# Patient Record
Sex: Female | Born: 1975 | Race: White | Hispanic: No | Marital: Married | Smoking: Never smoker
Health system: Southern US, Community
[De-identification: ages and names within clinical notes are randomized; demographics above are authoritative.]

## PROBLEM LIST (undated history)

## (undated) DIAGNOSIS — F32A Depression, unspecified: Secondary | ICD-10-CM

## (undated) DIAGNOSIS — F329 Major depressive disorder, single episode, unspecified: Secondary | ICD-10-CM

## (undated) DIAGNOSIS — F419 Anxiety disorder, unspecified: Secondary | ICD-10-CM

## (undated) DIAGNOSIS — R87619 Unspecified abnormal cytological findings in specimens from cervix uteri: Secondary | ICD-10-CM

## (undated) HISTORY — DX: Unspecified abnormal cytological findings in specimens from cervix uteri: R87.619

## (undated) HISTORY — DX: Major depressive disorder, single episode, unspecified: F32.9

## (undated) HISTORY — PX: WISDOM TOOTH EXTRACTION: SHX21

## (undated) HISTORY — DX: Anxiety disorder, unspecified: F41.9

## (undated) HISTORY — DX: Depression, unspecified: F32.A

---

## 2002-03-15 HISTORY — PX: COLPOSCOPY: SHX161

## 2006-03-15 HISTORY — PX: OTHER SURGICAL HISTORY: SHX169

## 2007-12-19 ENCOUNTER — Ambulatory Visit: Payer: Self-pay | Admitting: Oncology

## 2007-12-22 LAB — CBC & DIFF AND RETIC
BASO%: 0.9 % (ref 0.0–2.0)
Eosinophils Absolute: 0 10*3/uL (ref 0.0–0.5)
HCT: 34.3 % — ABNORMAL LOW (ref 34.8–46.6)
LYMPH%: 28.1 % (ref 14.0–48.0)
MCHC: 35 g/dL (ref 32.0–36.0)
MCV: 88.4 fL (ref 81.0–101.0)
MONO%: 8.2 % (ref 0.0–13.0)
NEUT%: 61.7 % (ref 39.6–76.8)
Platelets: 238 10*3/uL (ref 145–400)
RBC: 3.88 10*6/uL (ref 3.70–5.32)
Retic %: 2 % (ref 0.4–2.3)

## 2007-12-22 LAB — MORPHOLOGY

## 2007-12-27 LAB — TOXOPLASMA GONDII ANTIBODY, IGM: Toxoplasma Antibody- IgM: 0.44 IV

## 2007-12-27 LAB — ANA: Anti Nuclear Antibody(ANA): NEGATIVE

## 2007-12-27 LAB — HEMOGLOBINOPATHY EVALUATION
Hemoglobin Other: 0 % (ref 0.0–0.0)
Hgb A: 97.4 % (ref 96.8–97.8)
Hgb F Quant: 0 % (ref 0.0–2.0)
Hgb S Quant: 0 % (ref 0.0–0.0)

## 2007-12-27 LAB — TOXOPLASMA GONDII ANTIBODY, IGG: Toxoplasma IgG Ratio: 0.7 IU/mL

## 2007-12-27 LAB — EPSTEIN BARR VIRUS(EBV) BY PCR

## 2007-12-28 LAB — EHRLICHIA ANTIBODY PANEL: E chaffeensis (HGE) Ab, IgM: <1:20 {titer}

## 2016-03-15 HISTORY — PX: MOHS SURGERY: SHX181

## 2016-06-22 DIAGNOSIS — Z1231 Encounter for screening mammogram for malignant neoplasm of breast: Secondary | ICD-10-CM | POA: Diagnosis not present

## 2016-06-22 DIAGNOSIS — Z6827 Body mass index (BMI) 27.0-27.9, adult: Secondary | ICD-10-CM | POA: Diagnosis not present

## 2016-06-22 DIAGNOSIS — R635 Abnormal weight gain: Secondary | ICD-10-CM | POA: Diagnosis not present

## 2016-07-22 DIAGNOSIS — Z6826 Body mass index (BMI) 26.0-26.9, adult: Secondary | ICD-10-CM | POA: Diagnosis not present

## 2016-08-20 DIAGNOSIS — E663 Overweight: Secondary | ICD-10-CM | POA: Diagnosis not present

## 2016-11-19 DIAGNOSIS — D485 Neoplasm of uncertain behavior of skin: Secondary | ICD-10-CM | POA: Diagnosis not present

## 2016-11-19 DIAGNOSIS — I781 Nevus, non-neoplastic: Secondary | ICD-10-CM | POA: Diagnosis not present

## 2016-11-19 DIAGNOSIS — L718 Other rosacea: Secondary | ICD-10-CM | POA: Diagnosis not present

## 2016-12-07 DIAGNOSIS — D485 Neoplasm of uncertain behavior of skin: Secondary | ICD-10-CM | POA: Diagnosis not present

## 2016-12-07 DIAGNOSIS — D229 Melanocytic nevi, unspecified: Secondary | ICD-10-CM | POA: Diagnosis not present

## 2016-12-14 ENCOUNTER — Ambulatory Visit: Payer: Self-pay | Admitting: Obstetrics and Gynecology

## 2016-12-28 DIAGNOSIS — D229 Melanocytic nevi, unspecified: Secondary | ICD-10-CM | POA: Diagnosis not present

## 2016-12-28 DIAGNOSIS — D485 Neoplasm of uncertain behavior of skin: Secondary | ICD-10-CM | POA: Diagnosis not present

## 2017-03-10 DIAGNOSIS — H5213 Myopia, bilateral: Secondary | ICD-10-CM | POA: Diagnosis not present

## 2017-03-15 HISTORY — PX: BREAST BIOPSY: SHX20

## 2017-04-15 DIAGNOSIS — J209 Acute bronchitis, unspecified: Secondary | ICD-10-CM | POA: Diagnosis not present

## 2017-06-09 DIAGNOSIS — L578 Other skin changes due to chronic exposure to nonionizing radiation: Secondary | ICD-10-CM | POA: Diagnosis not present

## 2017-06-09 DIAGNOSIS — L905 Scar conditions and fibrosis of skin: Secondary | ICD-10-CM | POA: Diagnosis not present

## 2017-06-09 DIAGNOSIS — Z86018 Personal history of other benign neoplasm: Secondary | ICD-10-CM | POA: Diagnosis not present

## 2017-10-25 DIAGNOSIS — L219 Seborrheic dermatitis, unspecified: Secondary | ICD-10-CM | POA: Diagnosis not present

## 2017-10-25 DIAGNOSIS — R221 Localized swelling, mass and lump, neck: Secondary | ICD-10-CM | POA: Diagnosis not present

## 2017-10-25 DIAGNOSIS — L309 Dermatitis, unspecified: Secondary | ICD-10-CM | POA: Diagnosis not present

## 2018-01-23 ENCOUNTER — Other Ambulatory Visit: Payer: Self-pay | Admitting: Obstetrics and Gynecology

## 2018-01-23 DIAGNOSIS — Z1231 Encounter for screening mammogram for malignant neoplasm of breast: Secondary | ICD-10-CM

## 2018-01-25 ENCOUNTER — Encounter: Payer: Self-pay | Admitting: Radiology

## 2018-01-25 ENCOUNTER — Ambulatory Visit
Admission: RE | Admit: 2018-01-25 | Discharge: 2018-01-25 | Disposition: A | Discharge status: Home / Self Care | Payer: BLUE CROSS/BLUE SHIELD | Source: Ambulatory Visit | Attending: Obstetrics and Gynecology | Admitting: Obstetrics and Gynecology

## 2018-01-25 DIAGNOSIS — R928 Other abnormal and inconclusive findings on diagnostic imaging of breast: Secondary | ICD-10-CM | POA: Diagnosis not present

## 2018-01-25 DIAGNOSIS — Z1231 Encounter for screening mammogram for malignant neoplasm of breast: Secondary | ICD-10-CM | POA: Diagnosis not present

## 2018-01-31 ENCOUNTER — Other Ambulatory Visit: Payer: Self-pay | Admitting: Obstetrics and Gynecology

## 2018-01-31 DIAGNOSIS — N6310 Unspecified lump in the right breast, unspecified quadrant: Secondary | ICD-10-CM

## 2018-01-31 DIAGNOSIS — R928 Other abnormal and inconclusive findings on diagnostic imaging of breast: Secondary | ICD-10-CM

## 2018-02-02 ENCOUNTER — Telehealth: Payer: Self-pay | Admitting: Obstetrics and Gynecology

## 2018-02-02 NOTE — Telephone Encounter (Signed)
Patient is calling for results. Please advise  °

## 2018-02-02 NOTE — Telephone Encounter (Signed)
Spoke with patient please also schedule an annual visit for her sometime in the next few weeks

## 2018-02-03 NOTE — Telephone Encounter (Signed)
Patient is schedule 02/15/18 With AMS for annual

## 2018-02-07 ENCOUNTER — Other Ambulatory Visit: Payer: Self-pay | Admitting: Obstetrics and Gynecology

## 2018-02-07 ENCOUNTER — Ambulatory Visit
Admission: RE | Admit: 2018-02-07 | Discharge: 2018-02-07 | Disposition: A | Discharge status: Home / Self Care | Payer: BLUE CROSS/BLUE SHIELD | Source: Ambulatory Visit | Attending: Obstetrics and Gynecology | Admitting: Obstetrics and Gynecology

## 2018-02-07 DIAGNOSIS — R928 Other abnormal and inconclusive findings on diagnostic imaging of breast: Secondary | ICD-10-CM | POA: Diagnosis not present

## 2018-02-07 DIAGNOSIS — N6310 Unspecified lump in the right breast, unspecified quadrant: Secondary | ICD-10-CM

## 2018-02-07 DIAGNOSIS — N6311 Unspecified lump in the right breast, upper outer quadrant: Secondary | ICD-10-CM | POA: Diagnosis not present

## 2018-02-07 DIAGNOSIS — R922 Inconclusive mammogram: Secondary | ICD-10-CM | POA: Diagnosis not present

## 2018-02-15 ENCOUNTER — Other Ambulatory Visit (HOSPITAL_COMMUNITY)
Admission: RE | Admit: 2018-02-15 | Discharge: 2018-02-15 | Disposition: A | Discharge status: Home / Self Care | Payer: BLUE CROSS/BLUE SHIELD | Source: Ambulatory Visit | Attending: Obstetrics and Gynecology | Admitting: Obstetrics and Gynecology

## 2018-02-15 ENCOUNTER — Encounter: Payer: Self-pay | Admitting: Obstetrics and Gynecology

## 2018-02-15 ENCOUNTER — Ambulatory Visit (INDEPENDENT_AMBULATORY_CARE_PROVIDER_SITE_OTHER): Payer: BLUE CROSS/BLUE SHIELD | Admitting: Obstetrics and Gynecology

## 2018-02-15 VITALS — BP 130/69 | HR 64 | Ht 68.0 in | Wt 182.0 lb

## 2018-02-15 DIAGNOSIS — Z1329 Encounter for screening for other suspected endocrine disorder: Secondary | ICD-10-CM | POA: Diagnosis not present

## 2018-02-15 DIAGNOSIS — Z124 Encounter for screening for malignant neoplasm of cervix: Secondary | ICD-10-CM

## 2018-02-15 DIAGNOSIS — Z131 Encounter for screening for diabetes mellitus: Secondary | ICD-10-CM | POA: Diagnosis not present

## 2018-02-15 DIAGNOSIS — Z13220 Encounter for screening for lipoid disorders: Secondary | ICD-10-CM

## 2018-02-15 DIAGNOSIS — Z01419 Encounter for gynecological examination (general) (routine) without abnormal findings: Secondary | ICD-10-CM | POA: Diagnosis not present

## 2018-02-15 DIAGNOSIS — Z1239 Encounter for other screening for malignant neoplasm of breast: Secondary | ICD-10-CM

## 2018-02-15 NOTE — Progress Notes (Signed)
Gynecology Annual Exam  PCP: System, Pcp Not In  Chief Complaint:  Chief Complaint  Patient presents with  . Gynecologic Exam    History of Present Illness: Patient is a 42 y.o. [id number] presents for annual exam. The patient has no complaints today.   LMP: No LMP recorded. (Menstrual status: IUD). Absent on Mirena IUD   The patient is sexually active. She currently uses IUD for contraception. She denies dyspareunia.  The patient does perform self breast exams.  There is no notable family history of breast or ovarian cancer in her family.  The patient wears seatbelts: yes.   The patient has regular exercise: not asked.    The patient denies current symptoms of depression.    Review of Systems: Review of Systems  Constitutional: Negative for chills and fever.  HENT: Negative for congestion.   Respiratory: Negative for cough and shortness of breath.   Cardiovascular: Negative for chest pain and palpitations.  Gastrointestinal: Negative for abdominal pain, constipation, diarrhea, heartburn, nausea and vomiting.  Genitourinary: Negative for dysuria, frequency and urgency.  Skin: Negative for itching and rash.  Neurological: Negative for dizziness and headaches.  Endo/Heme/Allergies: Negative for polydipsia.  Psychiatric/Behavioral: Negative for depression.    Past Medical History:  Past Medical History:  Diagnosis Date  . Abnormal Pap smear of cervix   . Anxiety   . Depression     Past Surgical History:  Past Surgical History:  Procedure Laterality Date  . COLPOSCOPY  2004   WNL @ [facility]  . Mirena  2008  . MOHS SURGERY  2018   precancerous  . WISDOM TOOTH EXTRACTION      Obstetric History: [id number]  Family History:  Family History  Problem Relation Age of Onset  . Kidney disease Mother   . Kidney disease Maternal Grandmother   . Heart disease Maternal Grandfather   . Breast cancer Neg Hx     Social History:  Social History   Socioeconomic  History  . Marital status: Single    Spouse name: Not on file  . Number of children: Not on file  . Years of education: Not on file  . Highest education level: Not on file  Occupational History  . Not on file  Social Needs  . Financial resource strain: Not on file  . Food insecurity:    Worry: Not on file    Inability: Not on file  . Transportation needs:    Medical: Not on file    Non-medical: Not on file  Tobacco Use  . Smoking status: Never Smoker  . Smokeless tobacco: Never Used  Substance and Sexual Activity  . Alcohol use: Yes  . Drug use: Never  . Sexual activity: Yes    Partners: Male    Birth control/protection: IUD  Lifestyle  . Physical activity:    Days per week: Not on file    Minutes per session: Not on file  . Stress: Not on file  Relationships  . Social connections:    Talks on phone: Not on file    Gets together: Not on file    Attends religious service: Not on file    Active member of club or organization: Not on file    Attends meetings of clubs or organizations: Not on file    Relationship status: Not on file  . Intimate partner violence:    Fear of current or ex partner: Not on file    Emotionally abused: Not on file  Physically abused: Not on file    Forced sexual activity: Not on file  Other Topics Concern  . Not on file  Social History Narrative  . Not on file    Allergies:  No Known Allergies  Medications: Prior to Admission medications   Medication Sig Start Date End Date Taking? Authorizing Provider  buPROPion (WELLBUTRIN XL) 300 MG 24 hr tablet TAKE 1 TABLET BY MOUTH EVERY DAY 03/22/17  Yes [provider]  FLUoxetine (PROZAC) 20 MG capsule TAKE 1 CAPSULE BY MOUTH EVERY DAY 02/15/17  Yes [provider]  levonorgestrel (MIRENA) 20 MCG/24HR IUD by Intrauterine route.   Yes [provider]  ORACEA 40 MG capsule Take 40 mg by mouth daily. 11/19/17  Yes [provider]  triamcinolone cream (KENALOG)  0.1 % Apply topically. 10/25/17 10/25/18 Yes [provider]    Physical Exam Vitals: Blood pressure 130/69, pulse 64, height 5\' 8"  (1.727 m), weight 182 lb (82.6 kg).  General: NAD HEENT: normocephalic, anicteric Thyroid: no enlargement, no palpable nodules Pulmonary: No increased work of breathing, CTAB Cardiovascular: RRR, distal pulses 2+ Breast: Breast symmetrical, no tenderness, right upper outer quadrant has a well defined smoothed border mobile mass 2.5cm in size and approximately 4cm from the nipple line, no skin or nipple retraction present, no nipple discharge.  No axillary or supraclavicular lymphadenopathy. Abdomen: NABS, soft, non-tender, non-distended.  Umbilicus without lesions.  No hepatomegaly, splenomegaly or masses palpable. No evidence of hernia  Genitourinary:  External: Normal external female genitalia.  Normal urethral meatus, normal Bartholin's and Skene's glands.    Vagina: Normal vaginal mucosa, no evidence of prolapse.    Cervix: Grossly normal in appearance, no bleeding  Uterus: Non-enlarged, mobile, normal contour.  No CMT  Adnexa: ovaries non-enlarged, no adnexal masses  Rectal: deferred  Lymphatic: no evidence of inguinal lymphadenopathy Extremities: no edema, erythema, or tenderness Neurologic: Grossly intact Psychiatric: mood appropriate, affect full  Female chaperone present for pelvic and breast  portions of the physical exam    Assessment: 42 y.o. Z6X0960 routine annual exam  Plan: Problem List Items Addressed This Visit    None    Visit Diagnoses    Encounter for gynecological examination without abnormal finding    -  Primary   Relevant Orders   Cytology - PAP   CMP14+LP+TP+TSH+CBC/Plt   Screening for malignant neoplasm of cervix       Relevant Orders   Cytology - PAP   Breast screening       Thyroid disorder screening       Relevant Orders   CMP14+LP+TP+TSH+CBC/Plt   Screening for diabetes mellitus       Relevant Orders    CMP14+LP+TP+TSH+CBC/Plt   Lipid screening       Relevant Orders   CMP14+LP+TP+TSH+CBC/Plt      1) Mammogram - recommend yearly screening mammogram.  Scheduled for core needle biopsy for BI-RAD IV mammogram tomorrow  2) STI screening  was notoffered and therefore not obtained  3) ASCCP guidelines and rational discussed.  Patient opts for every 3 years screening interval  4) Contraception - the patient is currently using  IUD.  She is happy with her current form of contraception and plans to continue  5) Colonoscopy -- Screening recommended starting at age 23 for average risk individuals, age 69 for individuals deemed at increased risk (including African Americans) and recommended to continue until age 13.  For patient age 77-85 individualized approach is recommended.  Gold standard screening is via colonoscopy,  Cologuard screening is an acceptable alternative for patient unwilling or unable to undergo colonoscopy.  "Colorectal cancer screening for average?risk adults: 2018 guideline update from the American Cancer Society"CA: A Cancer Journal for Clinicians: Aug 11, 2016   6) Routine healthcare maintenance including cholesterol, diabetes screening discussed Ordered today  7) No follow-ups on file.   [name], MD, [facility], [facility] 02/15/2018, 10:09 AM

## 2018-02-16 ENCOUNTER — Ambulatory Visit
Admission: RE | Admit: 2018-02-16 | Discharge: 2018-02-16 | Disposition: A | Discharge status: Home / Self Care | Payer: BLUE CROSS/BLUE SHIELD | Source: Ambulatory Visit | Attending: Obstetrics and Gynecology | Admitting: Obstetrics and Gynecology

## 2018-02-16 DIAGNOSIS — R928 Other abnormal and inconclusive findings on diagnostic imaging of breast: Secondary | ICD-10-CM | POA: Diagnosis not present

## 2018-02-16 DIAGNOSIS — N6310 Unspecified lump in the right breast, unspecified quadrant: Secondary | ICD-10-CM | POA: Diagnosis not present

## 2018-02-16 DIAGNOSIS — D241 Benign neoplasm of right breast: Secondary | ICD-10-CM | POA: Diagnosis not present

## 2018-02-16 DIAGNOSIS — N6311 Unspecified lump in the right breast, upper outer quadrant: Secondary | ICD-10-CM | POA: Diagnosis not present

## 2018-02-16 LAB — CMP14+LP+TP+TSH+CBC/PLT
A/G RATIO: 1.8 (ref 1.2–2.2)
ALT: 18 IU/L (ref 0–32)
AST: 18 IU/L (ref 0–40)
Albumin: 4.3 g/dL (ref 3.5–5.5)
Alkaline Phosphatase: 62 IU/L (ref 39–117)
BILIRUBIN TOTAL: 0.2 mg/dL (ref 0.0–1.2)
BUN/Creatinine Ratio: 13 (ref 9–23)
BUN: 11 mg/dL (ref 6–24)
CHLORIDE: 102 mmol/L (ref 96–106)
CO2: 23 mmol/L (ref 20–29)
Calcium: 8.9 mg/dL (ref 8.7–10.2)
Cholesterol, Total: 241 mg/dL — ABNORMAL HIGH (ref 100–199)
Creatinine, Ser: 0.87 mg/dL (ref 0.57–1.00)
FREE THYROXINE INDEX: 1.6 (ref 1.2–4.9)
GFR calc Af Amer: 94 mL/min/{1.73_m2} (ref >59)
GFR, EST NON AFRICAN AMERICAN: 81 mL/min/{1.73_m2} (ref >59)
Globulin, Total: 2.4 g/dL (ref 1.5–4.5)
Glucose: 80 mg/dL (ref 65–99)
HDL: 82 mg/dL (ref >39)
HEMOGLOBIN: 13.1 g/dL (ref 11.1–15.9)
Hematocrit: 37.2 % (ref 34.0–46.6)
LDL Calculated: 145 mg/dL — ABNORMAL HIGH (ref 0–99)
LDL/HDL RATIO: 1.8 ratio (ref 0.0–3.2)
MCH: 33.3 pg — AB (ref 26.6–33.0)
MCHC: 35.2 g/dL (ref 31.5–35.7)
MCV: 95 fL (ref 79–97)
POTASSIUM: 4.3 mmol/L (ref 3.5–5.2)
Platelets: 193 10*3/uL (ref 150–450)
RBC: 3.93 x10E6/uL (ref 3.77–5.28)
RDW: 11.8 % — ABNORMAL LOW (ref 12.3–15.4)
Sodium: 141 mmol/L (ref 134–144)
T3 Uptake Ratio: 24 % (ref 24–39)
T4, Total: 6.5 ug/dL (ref 4.5–12.0)
TOTAL PROTEIN: 6.7 g/dL (ref 6.0–8.5)
TSH: 1.37 u[IU]/mL (ref 0.450–4.500)
Triglycerides: 70 mg/dL (ref 0–149)
VLDL Cholesterol Cal: 14 mg/dL (ref 5–40)
WBC: 5.6 10*3/uL (ref 3.4–10.8)

## 2018-02-16 LAB — CYTOLOGY - PAP
ADEQUACY: ABSENT
Diagnosis: NEGATIVE
HPV (WINDOPATH): NOT DETECTED

## 2018-02-17 LAB — SURGICAL PATHOLOGY

## 2018-03-15 HISTORY — PX: BREAST SURGERY: SHX581

## 2018-03-21 DIAGNOSIS — Z1281 Encounter for screening for malignant neoplasm of oral cavity: Secondary | ICD-10-CM | POA: Diagnosis not present

## 2018-03-21 DIAGNOSIS — K0510 Chronic gingivitis, plaque induced: Secondary | ICD-10-CM | POA: Diagnosis not present

## 2018-04-17 ENCOUNTER — Encounter: Payer: Self-pay | Admitting: Emergency Medicine

## 2018-04-17 ENCOUNTER — Other Ambulatory Visit: Payer: Self-pay

## 2018-04-17 ENCOUNTER — Ambulatory Visit
Admission: EM | Admit: 2018-04-17 | Discharge: 2018-04-17 | Disposition: A | Discharge status: Home / Self Care | Payer: BLUE CROSS/BLUE SHIELD | Attending: Family Medicine | Admitting: Family Medicine

## 2018-04-17 DIAGNOSIS — J101 Influenza due to other identified influenza virus with other respiratory manifestations: Secondary | ICD-10-CM | POA: Diagnosis not present

## 2018-04-17 LAB — RAPID INFLUENZA A&B ANTIGENS: Influenza A (ARMC): POSITIVE — AB

## 2018-04-17 LAB — RAPID INFLUENZA A&B ANTIGENS (ARMC ONLY): INFLUENZA B (ARMC): NEGATIVE

## 2018-04-17 MED ORDER — OSELTAMIVIR PHOSPHATE 75 MG PO CAPS: 75.0000 mg | ORAL_CAPSULE | Freq: Two times a day (BID) | ORAL | 0 refills | Status: DC

## 2018-04-17 NOTE — ED Triage Notes (Signed)
Patient in today c/o cough, body aches, headache and fever (102.5) x 1 days. Patient's last dose of Ibuprofen at 7am this morning.

## 2018-04-17 NOTE — Discharge Instructions (Addendum)
Take medication as prescribed. Rest. Drink plenty of fluids. Tylenol and ibuprofen as needed.  ° °Follow up with your primary care physician this week as needed. Return to Urgent care for new or worsening concerns.  ° °

## 2018-04-17 NOTE — ED Provider Notes (Signed)
[facility] ____________________________________________  Time seen: Approximately 12:26 PM  I have reviewed the triage vital signs and the nursing notes.   HISTORY  Chief Complaint Cough (APPT) and Generalized Body Aches  HPI Dana Fischer is a 43 y.o. female presenting for evaluation of 1 day of cough and congestion complaints with fever and generalized body aches.  States had slight sore throat this morning that has since resolved.  States cough is intermittent and mostly nonproductive, occasionally productive.  States felt completely fine throughout the day yesterday until late last night.  States woke up in the middle of night with body aches and fever, stating T-max 102.5.  Last took ibuprofen around 7 AM.  Continues to drink fluids well, decreased appetite.  States some tightness in chest and sore from coughing, but denies chest pain or shortness of breath.  Denies other aggravating alleviating factors.  Reports otherwise doing well denies other complaints.    Past Medical History:  Diagnosis Date  . Abnormal Pap smear of cervix   . Anxiety   . Depression     There are no active problems to display for this patient.   Past Surgical History:  Procedure Laterality Date  . COLPOSCOPY  2004   WNL @ [facility]  . Mirena  2008  . MOHS SURGERY  2018   precancerous  . WISDOM TOOTH EXTRACTION       No current facility-administered medications for this encounter.   Current Outpatient Medications:  .  buPROPion (WELLBUTRIN XL) 300 MG 24 hr tablet, TAKE 1 TABLET BY MOUTH EVERY DAY, Disp: , Rfl:  .  FLUoxetine (PROZAC) 20 MG capsule, TAKE 1 CAPSULE BY MOUTH EVERY DAY, Disp: , Rfl:  .  levonorgestrel (MIRENA) 20 MCG/24HR IUD, by Intrauterine route., Disp: , Rfl:  .  ORACEA 40 MG capsule, Take 40 mg by mouth daily., Disp: , Rfl: 5 .  triamcinolone cream (KENALOG) 0.1 %, Apply topically., Disp: , Rfl:  .  oseltamivir (TAMIFLU) 75 MG capsule, Take 1 capsule (75  mg total) by mouth every 12 (twelve) hours., Disp: 10 capsule, Rfl: 0  Allergies Patient has no known allergies.  Family History  Problem Relation Age of Onset  . Kidney disease Mother   . Kidney disease Maternal Grandmother   . Heart disease Maternal Grandfather   . Healthy Father   . Breast cancer Neg Hx     Social History Social History   Tobacco Use  . Smoking status: Never Smoker  . Smokeless tobacco: Never Used  Substance Use Topics  . Alcohol use: Yes    Comment: social  . Drug use: Never    Review of Systems Constitutional: Positive fever. ENT: As above Cardiovascular: Denies chest pain. Respiratory: Denies shortness of breath. Gastrointestinal: No abdominal pain.   Musculoskeletal: Negative for back pain. Skin: Negative for rash.   ____________________________________________   PHYSICAL EXAM:  VITAL SIGNS: ED Triage Vitals [04/17/18 1146]  Enc Vitals Group     BP 131/79     Pulse Rate 98     Resp 18     Temp 98.5 F (36.9 C)     Temp Source Oral     SpO2 98 %     Weight 185 lb (83.9 kg)     Height 5\' 7"  (1.702 m)     Head Circumference      Peak Flow      Pain Score 8     Pain Loc      Pain  Edu?      Excl. in GC?     Constitutional: Alert and oriented. Well appearing and in no acute distress. Eyes: Conjunctivae are normal.  Head: Atraumatic. No sinus tenderness to palpation. No swelling. No erythema.  Ears: no erythema, normal TMs bilaterally.   Nose:Nasal congestion  Mouth/Throat: Mucous membranes are moist. No pharyngeal erythema. No tonsillar swelling or exudate.  Neck: No stridor.  No cervical spine tenderness to palpation. Hematological/Lymphatic/Immunilogical: No cervical lymphadenopathy. Cardiovascular: Normal rate, regular rhythm. Grossly normal heart sounds.  Good peripheral circulation. Respiratory: Normal respiratory effort.  No retractions. No wheezes, rales or rhonchi. Good air movement.  Musculoskeletal: Ambulatory with  steady gait. Neurologic:  Normal speech and language. No gait instability. Skin:  Skin appears warm, dry and intact. No rash noted. Psychiatric: Mood and affect are normal. Speech and behavior are normal. ___________________________________________   LABS (all labs ordered are listed, but only abnormal results are displayed)  Labs Reviewed  RAPID INFLUENZA A&B ANTIGENS ([facility] ONLY) - Abnormal; Notable for the following components:      Result Value   Influenza A ([facility]) POSITIVE (*)    All other components within normal limits     PROCEDURES Procedures    INITIAL IMPRESSION / ASSESSMENT AND PLAN / ED COURSE  Pertinent labs & imaging results that were available during my care of the patient were reviewed by me and considered in my medical decision making (see chart for details).  Well-appearing patient.  No acute distress.  Influenza A positive.  Discussed treatment options with patient, Tamiflu given.  Encourage rest, fluids, supportive care, over-the-counter Tylenol ibuprofen as needed.Discussed indication, risks and benefits of medications with patient.  Discussed follow up with Primary care physician this week. Discussed follow up and return parameters including no resolution or any worsening concerns. Patient verbalized understanding and agreed to plan.   ____________________________________________   FINAL CLINICAL IMPRESSION(S) / ED DIAGNOSES  Final diagnoses:  Influenza A     ED Discharge Orders         Ordered    oseltamivir (TAMIFLU) 75 MG capsule  Every 12 hours     04/17/18 1207           Note: This dictation was prepared with Dragon dictation along with smaller phrase technology. Any transcriptional errors that result from this process are unintentional.         Renford Dills, NP 04/17/18 1229

## 2018-05-03 ENCOUNTER — Encounter: Payer: Self-pay | Admitting: Obstetrics and Gynecology

## 2018-05-03 ENCOUNTER — Ambulatory Visit (INDEPENDENT_AMBULATORY_CARE_PROVIDER_SITE_OTHER): Payer: BLUE CROSS/BLUE SHIELD | Admitting: Obstetrics and Gynecology

## 2018-05-03 VITALS — BP 121/78 | HR 79 | Ht 67.0 in | Wt 188.0 lb

## 2018-05-03 DIAGNOSIS — Z6830 Body mass index (BMI) 30.0-30.9, adult: Secondary | ICD-10-CM | POA: Diagnosis not present

## 2018-05-03 DIAGNOSIS — E669 Obesity, unspecified: Secondary | ICD-10-CM

## 2018-05-03 MED ORDER — PHENTERMINE HCL 37.5 MG PO TABS: 37.5000 mg | ORAL_TABLET | Freq: Every day | ORAL | 0 refills | Status: DC

## 2018-05-03 NOTE — Patient Instructions (Signed)
FAST FACTS . Body Mass Index (BMI) is one measurement that your doctor may use to discuss your weight . BMI is an estimate of body fat. Individuals with a BMI of 25.0-29.9 are considered overweight. Those with a BMI above 30.0 are considered obese . Obesity is a risk factor for many cancers, especially endometrial cancer. In fact, if you are obese, your risk for endometrial cancer may be 10 times higher. . Obesity may affect how your cancer is treated (surgery, chemotherapy, and/or radiation). . If you are overweight or obese, ask your doctor for information about diet and exercise programs.  EXERCISE Here is a list of resources for exercise recommendations and programs that can help you get started. Be sure to look for exercise programs and classes in your neighborhood to get personal support.  American Cancer Society (ACS): Eat Healthy and Get Active www.cancer.org/healthy/eathealthygetactive/ The site provides details about the importance of exercise in cancer prevention as well as resources providing exercise guidelines and tools to set goals and manage physical activity  American Council on Exercise (ACE): Get Fit www.acefitness.org/acefit  This site is full of fitness programs including personalized training workouts and a Art therapist of exercise programs. Links to local exercise trainers are provided.  American Heart Association: Getting Healthy - Physical Activity https://richard.com/ This site provides the American Heart Association guidelines for physical activity, tips for getting started and tips for long term success.  Calorie Counting for Weight Loss Calories are units of energy. Your body needs a certain amount of calories from food to keep you going throughout the day. When you eat more calories than your body needs, your body stores the extra calories as fat. When you eat fewer calories than your body  needs, your body burns fat to get the energy it needs. Calorie counting means keeping track of how many calories you eat and drink each day. Calorie counting can be helpful if you need to lose weight. If you make sure to eat fewer calories than your body needs, you should lose weight. Ask your health care provider what a healthy weight is for you. For calorie counting to work, you will need to eat the right number of calories in a day in order to lose a healthy amount of weight per week. A dietitian can help you determine how many calories you need in a day and will give you suggestions on how to reach your calorie goal.  A healthy amount of weight to lose per week is usually 1-2 lb (0.5-0.9 kg). This usually means that your daily calorie intake should be reduced by 500-750 calories.  Eating 1,200 - 1,500 calories per day can help most women lose weight.  Eating 1,500 - 1,800 calories per day can help most men lose weight. What is my plan? My goal is to have __________ calories per day. If I have this many calories per day, I should lose around __________ pounds per week. What do I need to know about calorie counting? In order to meet your daily calorie goal, you will need to:  Find out how many calories are in each food you would like to eat. Try to do this before you eat.  Decide how much of the food you plan to eat.  Write down what you ate and how many calories it had. Doing this is called keeping a food log. To successfully lose weight, it is important to balance calorie counting with a healthy lifestyle that includes regular activity. Aim for 150 minutes  of moderate exercise (such as walking) or 75 minutes of vigorous exercise (such as running) each week. Where do I find calorie information?  The number of calories in a food can be found on a Nutrition Facts label. If a food does not have a Nutrition Facts label, try to look up the calories online or ask your dietitian for  help. Remember that calories are listed per serving. If you choose to have more than one serving of a food, you will have to multiply the calories per serving by the amount of servings you plan to eat. For example, the label on a package of bread might say that a serving size is 1 slice and that there are 90 calories in a serving. If you eat 1 slice, you will have eaten 90 calories. If you eat 2 slices, you will have eaten 180 calories. How do I keep a food log? Immediately after each meal, record the following information in your food log:  What you ate. Don't forget to include toppings, sauces, and other extras on the food.  How much you ate. This can be measured in cups, ounces, or number of items.  How many calories each food and drink had.  The total number of calories in the meal. Keep your food log near you, such as in a small notebook in your pocket, or use a mobile app or website. Some programs will calculate calories for you and show you how many calories you have left for the day to meet your goal. What are some calorie counting tips?   Use your calories on foods and drinks that will fill you up and not leave you hungry: ? Some examples of foods that fill you up are nuts and nut butters, vegetables, lean proteins, and high-fiber foods like whole grains. High-fiber foods are foods with more than 5 g fiber per serving. ? Drinks such as sodas, specialty coffee drinks, alcohol, and juices have a lot of calories, yet do not fill you up.  Eat nutritious foods and avoid empty calories. Empty calories are calories you get from foods or beverages that do not have many vitamins or protein, such as candy, sweets, and soda. It is better to have a nutritious high-calorie food (such as an avocado) than a food with few nutrients (such as a bag of chips).  Know how many calories are in the foods you eat most often. This will help you calculate calorie counts faster.  Pay attention to calories in  drinks. Low-calorie drinks include water and unsweetened drinks.  Pay attention to nutrition labels for "low fat" or "fat free" foods. These foods sometimes have the same amount of calories or more calories than the full fat versions. They also often have added sugar, starch, or salt, to make up for flavor that was removed with the fat.  Find a way of tracking calories that works for you. Get creative. Try different apps or programs if writing down calories does not work for you. What are some portion control tips?  Know how many calories are in a serving. This will help you know how many servings of a certain food you can have.  Use a measuring cup to measure serving sizes. You could also try weighing out portions on a kitchen scale. With time, you will be able to estimate serving sizes for some foods.  Take some time to put servings of different foods on your favorite plates, bowls, and cups so you know what a   serving looks like.  Try not to eat straight from a bag or box. Doing this can lead to overeating. Put the amount you would like to eat in a cup or on a plate to make sure you are eating the right portion.  Use smaller plates, glasses, and bowls to prevent overeating.  Try not to multitask (for example, watch TV or use your computer) while eating. If it is time to eat, sit down at a table and enjoy your food. This will help you to know when you are full. It will also help you to be aware of what you are eating and how much you are eating. What are tips for following this plan? Reading food labels  Check the calorie count compared to the serving size. The serving size may be smaller than what you are used to eating.  Check the source of the calories. Make sure the food you are eating is high in vitamins and protein and low in saturated and trans fats. Shopping  Read nutrition labels while you shop. This will help you make healthy decisions before you decide to purchase your  food.  Make a grocery list and stick to it. Cooking  Try to cook your favorite foods in a healthier way. For example, try baking instead of frying.  Use low-fat dairy products. Meal planning  Use more fruits and vegetables. Half of your plate should be fruits and vegetables.  Include lean proteins like poultry and fish. How do I count calories when eating out?  Ask for smaller portion sizes.  Consider sharing an entree and sides instead of getting your own entree.  If you get your own entree, eat only half. Ask for a box at the beginning of your meal and put the rest of your entree in it so you are not tempted to eat it.  If calories are listed on the menu, choose the lower calorie options.  Choose dishes that include vegetables, fruits, whole grains, low-fat dairy products, and lean protein.  Choose items that are boiled, broiled, grilled, or steamed. Stay away from items that are buttered, battered, fried, or served with cream sauce. Items labeled "crispy" are usually fried, unless stated otherwise.  Choose water, low-fat milk, unsweetened iced tea, or other drinks without added sugar. If you want an alcoholic beverage, choose a lower calorie option such as a glass of wine or light beer.  Ask for dressings, sauces, and syrups on the side. These are usually high in calories, so you should limit the amount you eat.  If you want a salad, choose a garden salad and ask for grilled meats. Avoid extra toppings like bacon, cheese, or fried items. Ask for the dressing on the side, or ask for olive oil and vinegar or lemon to use as dressing.  Estimate how many servings of a food you are given. For example, a serving of cooked rice is  cup or about the size of half a baseball. Knowing serving sizes will help you be aware of how much food you are eating at restaurants. The list below tells you how big or small some common portion sizes are based on everyday objects: ? 1 oz-4 stacked  dice. ? 3 oz-1 deck of cards. ? 1 tsp-1 die. ? 1 Tbsp- a ping-pong ball. ? 2 Tbsp-1 ping-pong ball. ?  cup- baseball. ? 1 cup-1 baseball. Summary  Calorie counting means keeping track of how many calories you eat and drink each day. If you eat   fewer calories than your body needs, you should lose weight.  A healthy amount of weight to lose per week is usually 1-2 lb (0.5-0.9 kg). This usually means reducing your daily calorie intake by 500-750 calories.  The number of calories in a food can be found on a Nutrition Facts label. If a food does not have a Nutrition Facts label, try to look up the calories online or ask your dietitian for help.  Use your calories on foods and drinks that will fill you up, and not on foods and drinks that will leave you hungry.  Use smaller plates, glasses, and bowls to prevent overeating. This information is not intended to replace advice given to you by your health care provider. Make sure you discuss any questions you have with your health care provider. Document Released: 03/01/2005 Document Revised: 11/18/2017 Document Reviewed: 01/30/2016 Elsevier Interactive Patient Education  2019 [employer].

## 2018-05-03 NOTE — Progress Notes (Signed)
Gynecology Office Visit  Chief Complaint:  Chief Complaint  Patient presents with  . Follow-up    Depression and anxiety    History of Present Illness: Patientis a 43 y.o. [id number] female, who presents for the evaluation of weight gain. She has gained 18 pounds primarily over 4 years The patient states the following issues have contributed to her weight problem: lifestyle, activity.  The patient has no additional symptoms. The patient specifically denies memory loss, muscle weakness, excessive thirst, and polyuria. Weight related co-morbidities include none. The patient's past medical history is notable for none. She has tried phentermine interventions in the past but only for 2 weeks.   Review of Systems: 10 point review of systems negative unless otherwise noted in HPI  Past Medical History:  Past Medical History:  Diagnosis Date  . Abnormal Pap smear of cervix   . Anxiety   . Depression     Past Surgical History:  Past Surgical History:  Procedure Laterality Date  . BREAST SURGERY  03/2018   Right breast biopsy/ Malignant  . COLPOSCOPY  2004   WNL @ [facility]  . Mirena  2008  . MOHS SURGERY  2018   precancerous  . WISDOM TOOTH EXTRACTION      Gynecologic History: No LMP recorded. (Menstrual status: IUD).  Obstetric History: [id number]  Family History:  Family History  Problem Relation Age of Onset  . Kidney disease Mother   . Kidney disease Maternal Grandmother   . Heart disease Maternal Grandfather   . Healthy Father   . Breast cancer Neg Hx     Social History:  Social History   Socioeconomic History  . Marital status: Single    Spouse name: Not on file  . Number of children: Not on file  . Years of education: Not on file  . Highest education level: Not on file  Occupational History  . Not on file  Social Needs  . Financial resource strain: Not on file  . Food insecurity:    Worry: Not on file    Inability: Not on file  . Transportation  needs:    Medical: Not on file    Non-medical: Not on file  Tobacco Use  . Smoking status: Never Smoker  . Smokeless tobacco: Never Used  Substance and Sexual Activity  . Alcohol use: Yes    Comment: social  . Drug use: Never  . Sexual activity: Yes    Partners: Male    Birth control/protection: I.U.D.  Lifestyle  . Physical activity:    Days per week: Not on file    Minutes per session: Not on file  . Stress: Not on file  Relationships  . Social connections:    Talks on phone: Not on file    Gets together: Not on file    Attends religious service: Not on file    Active member of club or organization: Not on file    Attends meetings of clubs or organizations: Not on file    Relationship status: Not on file  . Intimate partner violence:    Fear of current or ex partner: Not on file    Emotionally abused: Not on file    Physically abused: Not on file    Forced sexual activity: Not on file  Other Topics Concern  . Not on file  Social History Narrative  . Not on file    Allergies:  No Known Allergies  Medications: Prior to Admission medications   Medication  Sig Start Date End Date Taking? Authorizing Provider  buPROPion (WELLBUTRIN XL) 300 MG 24 hr tablet TAKE 1 TABLET BY MOUTH EVERY DAY 03/22/17  Yes [provider]  FLUoxetine (PROZAC) 20 MG capsule TAKE 1 CAPSULE BY MOUTH EVERY DAY 02/15/17  Yes [provider]  levonorgestrel (MIRENA) 20 MCG/24HR IUD by Intrauterine route.   Yes [provider]  ORACEA 40 MG capsule Take 40 mg by mouth daily. 11/19/17  Yes [provider]  phentermine (ADIPEX-P) 37.5 MG tablet Take 1 tablet (37.5 mg total) by mouth daily before breakfast. 05/03/18   [name], MD   Depression screen [facility] 2/9 05/03/2018  Decreased Interest 1  Down, Depressed, Hopeless 0  PHQ - 2 Score 1  Altered sleeping 2  Tired, decreased energy 3  Change in appetite 2  Feeling bad or failure about yourself  1  Trouble  concentrating 1  Moving slowly or fidgety/restless 0  Suicidal thoughts 0  PHQ-9 Score 10  Difficult doing work/chores Not difficult at all   GAD 7 : Generalized Anxiety Score 05/03/2018  Nervous, Anxious, on Edge 0  Control/stop worrying 0  Worry too much - different things 0  Trouble relaxing 3  Restless 3  Easily annoyed or irritable 1  Afraid - awful might happen 1  Total GAD 7 Score 8  Anxiety Difficulty (No Data)     Physical Exam Blood pressure 121/78, pulse 79, height 5\' 7"  (1.702 m), weight 188 lb (85.3 kg). No LMP recorded. (Menstrual status: IUD). Body mass index is 29.44 kg/m.  General: NAD HEENT: normocephalic, anicteric Thyroid: no enlargement Pulmonary: no increased work of breathing Neurologic: Grossly intact Psychiatric: mood appropriate, affect full  Assessment: 43 y.o. [id number] presenting for discussion of weight loss management options  Plan: Problem List Items Addressed This Visit    None      1) 1500 Calorie ADA Diet  2) Patient education given regarding appropriate lifestyle changes for weight loss including: regular physical activity, healthy coping strategies, caloric restriction and healthy eating patterns.  3) Patient will be started on weight loss medication. The risks and benefits and side effects of medication, such as Adipex (Phenteramine) ,  Tenuate (Diethylproprion), Belviq (lorcarsin), Contrave (buproprion/naltrexone), Qsymia (phentermine/topiramate), and Saxenda (liraglutide) is discussed. The pros and cons of suppressing appetite and boosting metabolism is discussed. Risks of tolerence and addiction is discussed for selected agents discussed. Use of medicine will ne short term, such as 3-4 months at a time followed by a period of time off of the medicine to avoid these risks and side effects for Adipex, Qsymia, and Tenuate discussed. Pt to call with any negative side effects and agrees to keep follow up appts. - rx phentermine  4)  Comorbidity Screening - hypothyroidism screening, diabetes, and hyperlipidemia screening previously obtained and normal December 2019  5) Encouraged weekly weight monitorig to track progress and sample 1 week food diary  6) Contraception - discussed that all weight loss drugs fall in to pregnancy category X, patient currently has reliable contraception in the form of Mirena IUD placed 07/13/2012  7) 15 minutes face-to-face; counseling/coordination of care > 50 percent of visit  8) Return in about 4 weeks (around 05/31/2018) for weight loss follow.    [name], MD, [facility], [facility] 05/03/2018, 9:36 AM

## 2018-05-31 ENCOUNTER — Other Ambulatory Visit: Payer: Self-pay

## 2018-05-31 ENCOUNTER — Ambulatory Visit (INDEPENDENT_AMBULATORY_CARE_PROVIDER_SITE_OTHER): Payer: BLUE CROSS/BLUE SHIELD | Admitting: Obstetrics and Gynecology

## 2018-05-31 ENCOUNTER — Encounter: Payer: Self-pay | Admitting: Obstetrics and Gynecology

## 2018-05-31 VITALS — BP 111/79 | Ht 67.0 in | Wt 181.0 lb

## 2018-05-31 DIAGNOSIS — Z6828 Body mass index (BMI) 28.0-28.9, adult: Secondary | ICD-10-CM

## 2018-05-31 DIAGNOSIS — E663 Overweight: Secondary | ICD-10-CM

## 2018-05-31 MED ORDER — PHENTERMINE HCL 37.5 MG PO TABS: 37.5000 mg | ORAL_TABLET | Freq: Every day | ORAL | 0 refills | Status: DC

## 2018-05-31 NOTE — Progress Notes (Signed)
Gynecology Office Visit  Chief Complaint: No chief complaint on file.   History of Present Illness: Patientis a 43 y.o. [id number] female, who presents for the evaluation of the desire to lose weight. She has lost 7 pounds 1 months. The patient states the following symptoms since starting her weight loss therapy: appetite suppression, energy, and weight loss.  The patient also reports no other ill effects. The patient specifically denies heart palpitations, anxiety, and insomnia.    Review of Systems: 10 point review of systems negative unless otherwise noted in HPI  Past Medical History:  Past Medical History:  Diagnosis Date  . Abnormal Pap smear of cervix   . Anxiety   . Depression     Past Surgical History:  Past Surgical History:  Procedure Laterality Date  . BREAST SURGERY  03/2018   Right breast biopsy/ Malignant  . COLPOSCOPY  2004   WNL @ [facility]  . Mirena  2008  . MOHS SURGERY  2018   precancerous  . WISDOM TOOTH EXTRACTION      Gynecologic History: No LMP recorded. (Menstrual status: IUD).  Obstetric History: [id number]  Family History:  Family History  Problem Relation Age of Onset  . Kidney disease Mother   . Kidney disease Maternal Grandmother   . Heart disease Maternal Grandfather   . Healthy Father   . Breast cancer Neg Hx     Social History:  Social History   Socioeconomic History  . Marital status: Single    Spouse name: Not on file  . Number of children: Not on file  . Years of education: Not on file  . Highest education level: Not on file  Occupational History  . Not on file  Social Needs  . Financial resource strain: Not on file  . Food insecurity:    Worry: Not on file    Inability: Not on file  . Transportation needs:    Medical: Not on file    Non-medical: Not on file  Tobacco Use  . Smoking status: Never Smoker  . Smokeless tobacco: Never Used  Substance and Sexual Activity  . Alcohol use: Yes    Comment:  social  . Drug use: Never  . Sexual activity: Yes    Partners: Male    Birth control/protection: I.U.D.  Lifestyle  . Physical activity:    Days per week: Not on file    Minutes per session: Not on file  . Stress: Not on file  Relationships  . Social connections:    Talks on phone: Not on file    Gets together: Not on file    Attends religious service: Not on file    Active member of club or organization: Not on file    Attends meetings of clubs or organizations: Not on file    Relationship status: Not on file  . Intimate partner violence:    Fear of current or ex partner: Not on file    Emotionally abused: Not on file    Physically abused: Not on file    Forced sexual activity: Not on file  Other Topics Concern  . Not on file  Social History Narrative  . Not on file    Allergies:  No Known Allergies  Medications: Prior to Admission medications   Medication Sig Start Date End Date Taking? Authorizing Provider  buPROPion (WELLBUTRIN XL) 300 MG 24 hr tablet TAKE 1 TABLET BY MOUTH EVERY DAY 03/22/17  Yes [provider]  FLUoxetine (  PROZAC) 20 MG capsule TAKE 1 CAPSULE BY MOUTH EVERY DAY 02/15/17  Yes [provider]  levonorgestrel (MIRENA) 20 MCG/24HR IUD by Intrauterine route.   Yes [provider]  ORACEA 40 MG capsule Take 40 mg by mouth daily. 11/19/17  Yes [provider]  phentermine (ADIPEX-P) 37.5 MG tablet Take 1 tablet (37.5 mg total) by mouth daily before breakfast. 05/03/18  Yes [name], MD    Physical Exam Blood pressure 111/79, height 5\' 7"  (1.702 m), weight 181 lb (82.1 kg). Wt Readings from Last 3 Encounters:  05/31/18 181 lb (82.1 kg)  05/03/18 188 lb (85.3 kg)  04/17/18 185 lb (83.9 kg)  Body mass index is 28.35 kg/m.  General: NAD HEENT: normocephalic, anicteric Thyroid: no enlargement Pulmonary: no increased work of breathing Neurologic: Grossly intact Psychiatric: mood appropriate, affect full   Assessment: 43 y.o. [id number] No problem-specific Assessment & Plan notes found for this encounter.   Plan: Problem List Items Addressed This Visit    None    Visit Diagnoses    Overweight (BMI 25.0-29.9)    -  Primary   BMI 28.0-28.9,adult          1) 1500 Calorie ADA Diet  2) Patient education given regarding appropriate lifestyle changes for weight loss including: regular physical activity, healthy coping strategies, caloric restriction and healthy eating patterns.  3) Patient will be started on weight loss medication. The risks and benefits and side effects of medication, such as Adipex (Phenteramine) ,  Tenuate (Diethylproprion), Belviq (lorcarsin), Contrave (buproprion/naltrexone), Qsymia (phentermine/topiramate), and Saxenda (liraglutide) is discussed. The pros and cons of suppressing appetite and boosting metabolism is discussed. Risks of tolerence and addiction is discussed for selected agents discussed. Use of medicine will ne short term, such as 3-4 months at a time followed by a period of time off of the medicine to avoid these risks and side effects for Adipex, Qsymia, and Tenuate discussed. Pt to call with any negative side effects and agrees to keep follow up appts.  4) Patient to take medication, with the benefits of appetite suppression and metabolism boost d/w pt, along with the side effects and risk factors of long term use that will be avoided with our use of short bursts of therapy. Rx provided.    5) 15 minutes face-to-face; with counseling/coordination of care > 50 percent of visit related to obesity and ongoing management/treatment   6)  Return in about 4 weeks (around 06/28/2018) for medication follow up.    [name], MD, [facility], [facility] 05/31/2018, 8:57 AM

## 2018-07-04 ENCOUNTER — Other Ambulatory Visit: Payer: Self-pay

## 2018-07-04 ENCOUNTER — Encounter: Payer: Self-pay | Admitting: Obstetrics and Gynecology

## 2018-07-04 ENCOUNTER — Ambulatory Visit (INDEPENDENT_AMBULATORY_CARE_PROVIDER_SITE_OTHER): Payer: BLUE CROSS/BLUE SHIELD | Admitting: Obstetrics and Gynecology

## 2018-07-04 VITALS — Ht 67.0 in | Wt 175.0 lb

## 2018-07-04 DIAGNOSIS — Z6827 Body mass index (BMI) 27.0-27.9, adult: Secondary | ICD-10-CM

## 2018-07-04 DIAGNOSIS — E663 Overweight: Secondary | ICD-10-CM

## 2018-07-04 MED ORDER — PHENTERMINE HCL 37.5 MG PO TABS: 37.5000 mg | ORAL_TABLET | Freq: Every day | ORAL | 0 refills | Status: DC

## 2018-07-04 NOTE — Progress Notes (Signed)
I connected with Dana Fischer on 07/04/18 at  1:50 PM EDT by telephone and verified that I am speaking with the correct person using two identifiers.   I discussed the limitations, risks, security and privacy concerns of performing an evaluation and management service by telephone and the availability of in person appointments. I also discussed with the patient that there may be a patient responsible charge related to this service. The patient expressed understanding and agreed to proceed.  The patient was at home I spoke with the patient from my workstation phone The names of people involved in this encounter were: Dana Fischer , and [name]   Gynecology Office Visit  Chief Complaint:  Chief Complaint  Patient presents with  . Weight Check    History of Present Illness: Patientis a 43 y.o. [id number] female, who presents for the evaluation of the desire to lose weight. She has lost 6 pounds 1 months. The patient states the following symptoms since starting her weight loss therapy: appetite suppression, energy, and weight loss.  The patient also reports no other ill effects. The patient specifically denies heart palpitations, anxiety, and insomnia.    Review of Systems: 10 point review of systems negative unless otherwise noted in HPI  Past Medical History:  Past Medical History:  Diagnosis Date  . Abnormal Pap smear of cervix   . Anxiety   . Depression     Past Surgical History:  Past Surgical History:  Procedure Laterality Date  . BREAST SURGERY  03/2018   Right breast biopsy/ Malignant  . COLPOSCOPY  2004   WNL @ [facility]  . Mirena  2008  . MOHS SURGERY  2018   precancerous  . WISDOM TOOTH EXTRACTION      Gynecologic History: No LMP recorded. (Menstrual status: IUD).  Obstetric History: [id number]  Family History:  Family History  Problem Relation Age of Onset  . Kidney disease Mother   . Kidney disease Maternal Grandmother   . Heart disease  Maternal Grandfather   . Healthy Father   . Breast cancer Neg Hx     Social History:  Social History   Socioeconomic History  . Marital status: Married    Spouse name: Not on file  . Number of children: Not on file  . Years of education: Not on file  . Highest education level: Not on file  Occupational History  . Not on file  Social Needs  . Financial resource strain: Not on file  . Food insecurity:    Worry: Not on file    Inability: Not on file  . Transportation needs:    Medical: Not on file    Non-medical: Not on file  Tobacco Use  . Smoking status: Never Smoker  . Smokeless tobacco: Never Used  Substance and Sexual Activity  . Alcohol use: Yes    Comment: social  . Drug use: Never  . Sexual activity: Yes    Partners: Male    Birth control/protection: I.U.D.  Lifestyle  . Physical activity:    Days per week: Not on file    Minutes per session: Not on file  . Stress: Not on file  Relationships  . Social connections:    Talks on phone: Not on file    Gets together: Not on file    Attends religious service: Not on file    Active member of club or organization: Not on file    Attends meetings of clubs or organizations: Not on  file    Relationship status: Not on file  . Intimate partner violence:    Fear of current or ex partner: Not on file    Emotionally abused: Not on file    Physically abused: Not on file    Forced sexual activity: Not on file  Other Topics Concern  . Not on file  Social History Narrative  . Not on file    Allergies:  No Known Allergies  Medications: Prior to Admission medications   Medication Sig Start Date End Date Taking? Authorizing Provider  buPROPion (WELLBUTRIN XL) 300 MG 24 hr tablet TAKE 1 TABLET BY MOUTH EVERY DAY 03/22/17   [provider]  FLUoxetine (PROZAC) 20 MG capsule TAKE 1 CAPSULE BY MOUTH EVERY DAY 02/15/17   [provider]  levonorgestrel (MIRENA) 20 MCG/24HR IUD by Intrauterine route.     [provider]  ORACEA 40 MG capsule Take 40 mg by mouth daily. 11/19/17   [provider]  phentermine (ADIPEX-P) 37.5 MG tablet Take 1 tablet (37.5 mg total) by mouth daily before breakfast. 05/31/18   Vena AustriaStaebler, Faven Watterson, MD    Physical Exam There were no vitals taken for this visit. Wt Readings from Last 3 Encounters:  07/04/18 175 lb (79.4 kg)  05/31/18 181 lb (82.1 kg)  05/03/18 188 lb (85.3 kg)  Body mass index is 27.41 kg/m.  No physical exam as this was a remote telephone visit to promote social distancing during the current COVID-19 Pandemic  Assessment: 43 y.o. [id number] medical weight loss follow up  Plan: Problem List Items Addressed This Visit    None    Visit Diagnoses    Overweight (BMI 25.0-29.9)    -  Primary   BMI 27.0-27.9,adult          1) 1500 Calorie ADA Diet  2) Patient education given regarding appropriate lifestyle changes for weight loss including: regular physical activity, healthy coping strategies, caloric restriction and healthy eating patterns.  3) Patient to take medication, with the benefits of appetite suppression and metabolism boost d/w pt, along with the side effects and risk factors of long term use that will be avoided with our use of short bursts of therapy. Rx provided.    4) Telephone time: 6min 47 seconds  5)  Return in about 4 weeks (around 08/01/2018) for medication follow up (phone).    Vena AustriaAndreas Kali Deadwyler, MD, Evern CoreFACOG [facility], [facility] 07/04/2018, 2:22 PM

## 2018-07-06 ENCOUNTER — Ambulatory Visit: Payer: BLUE CROSS/BLUE SHIELD | Admitting: Obstetrics and Gynecology

## 2018-08-02 DIAGNOSIS — L578 Other skin changes due to chronic exposure to nonionizing radiation: Secondary | ICD-10-CM | POA: Diagnosis not present

## 2018-08-02 DIAGNOSIS — Z86018 Personal history of other benign neoplasm: Secondary | ICD-10-CM | POA: Diagnosis not present

## 2018-08-02 DIAGNOSIS — X32XXXS Exposure to sunlight, sequela: Secondary | ICD-10-CM | POA: Diagnosis not present

## 2018-08-02 DIAGNOSIS — L740 Miliaria rubra: Secondary | ICD-10-CM | POA: Diagnosis not present

## 2018-08-03 ENCOUNTER — Encounter: Payer: Self-pay | Admitting: Obstetrics and Gynecology

## 2018-08-03 ENCOUNTER — Ambulatory Visit (INDEPENDENT_AMBULATORY_CARE_PROVIDER_SITE_OTHER): Payer: BLUE CROSS/BLUE SHIELD | Admitting: Obstetrics and Gynecology

## 2018-08-03 ENCOUNTER — Other Ambulatory Visit: Payer: Self-pay

## 2018-08-03 VITALS — Ht 67.0 in | Wt 176.0 lb

## 2018-08-03 DIAGNOSIS — E663 Overweight: Secondary | ICD-10-CM | POA: Diagnosis not present

## 2018-08-03 DIAGNOSIS — Z6827 Body mass index (BMI) 27.0-27.9, adult: Secondary | ICD-10-CM

## 2018-08-03 NOTE — Progress Notes (Signed)
I connected with Dana Fischer on 08/03/18 at  9:10 AM EDT by telephone and verified that I am speaking with the correct person using two identifiers.   I discussed the limitations, risks, security and privacy concerns of performing an evaluation and management service by telephone and the availability of in person appointments. I also discussed with the patient that there may be a patient responsible charge related to this service. The patient expressed understanding and agreed to proceed.  The patient was at home I spoke with the patient from my workstation phone The names of people involved in this encounter were: Dana Fischer , and [name]   Gynecology Office Visit  Chief Complaint:  Chief Complaint  Patient presents with  . Weight Check    History of Present Illness: Patientis a 43 y.o. [id number] female, who presents for the evaluation of the desire to lose weight. She has lost 0 pounds 1 months, total weight loss 13lbs. The patient states the following symptoms since starting her weight loss therapy: appetite suppression, energy, and weight loss.  The patient also reports no other ill effects. The patient specifically denies heart palpitations, anxiety, and insomnia.    Review of Systems: 10 point review of systems negative unless otherwise noted in HPI  Past Medical History:  Past Medical History:  Diagnosis Date  . Abnormal Pap smear of cervix   . Anxiety   . Depression     Past Surgical History:  Past Surgical History:  Procedure Laterality Date  . BREAST SURGERY  03/2018   Right breast biopsy/ Malignant  . COLPOSCOPY  2004   WNL @ [facility]  . Mirena  2008  . MOHS SURGERY  2018   precancerous  . WISDOM TOOTH EXTRACTION      Gynecologic History: No LMP recorded. (Menstrual status: IUD).  Obstetric History: [id number]  Family History:  Family History  Problem Relation Age of Onset  . Kidney disease Mother   . Kidney disease Maternal  Grandmother   . Heart disease Maternal Grandfather   . Healthy Father   . Breast cancer Neg Hx     Social History:  Social History   Socioeconomic History  . Marital status: Married    Spouse name: Not on file  . Number of children: Not on file  . Years of education: Not on file  . Highest education level: Not on file  Occupational History  . Not on file  Social Needs  . Financial resource strain: Not on file  . Food insecurity:    Worry: Not on file    Inability: Not on file  . Transportation needs:    Medical: Not on file    Non-medical: Not on file  Tobacco Use  . Smoking status: Never Smoker  . Smokeless tobacco: Never Used  Substance and Sexual Activity  . Alcohol use: Yes    Comment: social  . Drug use: Never  . Sexual activity: Yes    Partners: Male    Birth control/protection: I.U.D.  Lifestyle  . Physical activity:    Days per week: Not on file    Minutes per session: Not on file  . Stress: Not on file  Relationships  . Social connections:    Talks on phone: Not on file    Gets together: Not on file    Attends religious service: Not on file    Active member of club or organization: Not on file    Attends meetings of clubs  or organizations: Not on file    Relationship status: Not on file  . Intimate partner violence:    Fear of current or ex partner: Not on file    Emotionally abused: Not on file    Physically abused: Not on file    Forced sexual activity: Not on file  Other Topics Concern  . Not on file  Social History Narrative  . Not on file    Allergies:  No Known Allergies  Medications: Prior to Admission medications   Medication Sig Start Date End Date Taking? Authorizing Provider  buPROPion (WELLBUTRIN XL) 300 MG 24 hr tablet TAKE 1 TABLET BY MOUTH EVERY DAY 03/22/17  Yes [provider]  FLUoxetine (PROZAC) 20 MG capsule TAKE 1 CAPSULE BY MOUTH EVERY DAY 02/15/17  Yes [provider]  levonorgestrel (MIRENA) 20 MCG/24HR  IUD by Intrauterine route.   Yes [provider]  ORACEA 40 MG capsule Take 40 mg by mouth daily. 11/19/17  Yes [provider]  phentermine (ADIPEX-P) 37.5 MG tablet Take 1 tablet (37.5 mg total) by mouth daily before breakfast. 07/04/18  Yes Vena AustriaStaebler, Danity Schmelzer, MD    Physical Exam Height 5\' 7"  (1.702 m), weight 176 lb (79.8 kg). Wt Readings from Last 3 Encounters:  08/03/18 176 lb (79.8 kg)  07/04/18 175 lb (79.4 kg)  05/31/18 181 lb (82.1 kg)  Body mass index is 27.57 kg/m.  No physical exam as this was a remote telephone visit to promote social distancing during the current COVID-19 Pandemic  Assessment: 43 y.o. [id number] medical weight loss follow up  Plan: Problem List Items Addressed This Visit    None    Visit Diagnoses    Overweight (BMI 25.0-29.9)    -  Primary   BMI 27.0-27.9,adult          1) 1500 Calorie ADA Diet  2) Patient education given regarding appropriate lifestyle changes for weight loss including: regular physical activity, healthy coping strategies, caloric restriction and healthy eating patterns.  3) Discontinue phentermine will do 3 month medication free interval than re-evaluate.  Safety net for early start set at 5lbs.  Discussed continued weight loss is ideal in 3 months off period but maintenance of current weight is also an acceptable goal.   4) Telephone Time 10:02  5)  Return in about 3 months (around 11/03/2018) for weight loss follow up.    Vena AustriaAndreas Porcia Morganti, MD, Merlinda FrederickFACOG [facility], [facility] 08/03/2018, 9:42 AM

## 2018-11-07 ENCOUNTER — Ambulatory Visit (INDEPENDENT_AMBULATORY_CARE_PROVIDER_SITE_OTHER): Payer: BLUE CROSS/BLUE SHIELD | Admitting: Obstetrics and Gynecology

## 2018-11-07 ENCOUNTER — Ambulatory Visit: Payer: BLUE CROSS/BLUE SHIELD | Admitting: Obstetrics and Gynecology

## 2018-11-07 ENCOUNTER — Other Ambulatory Visit: Payer: Self-pay

## 2018-11-07 DIAGNOSIS — Z6827 Body mass index (BMI) 27.0-27.9, adult: Secondary | ICD-10-CM

## 2018-11-07 DIAGNOSIS — E663 Overweight: Secondary | ICD-10-CM | POA: Diagnosis not present

## 2018-11-07 MED ORDER — PHENTERMINE HCL 37.5 MG PO TABS: 37.5000 mg | ORAL_TABLET | Freq: Every day | ORAL | 0 refills | Status: DC

## 2018-11-07 NOTE — Progress Notes (Signed)
I connected with Dana Fischer on 11/07/18 at  3:50 PM EDT by telephone and verified that I am speaking with the correct person using two identifiers.   I discussed the limitations, risks, security and privacy concerns of performing an evaluation and management service by telephone and the availability of in person appointments. I also discussed with the patient that there may be a patient responsible charge related to this service. The patient expressed understanding and agreed to proceed.  The patient was at home I spoke with the patient from my workstation phone The names of people involved in this encounter were: Dana Fischer , and Dana Fischer   Gynecology Office Visit  Chief Complaint: No chief complaint on file.   History of Present Illness: Patientis a 43 y.o. [id number] female, who presents for the evaluation of the desire to lose weight. She has lost 1 pounds since her last phentermine course. She states she has been doing well as far as moods on her regimen of prozac and Wellbutrin.  She is interested in restarting phentermine.  Review of Systems: 10 point review of systems negative unless otherwise noted in HPI  Past Medical History:  Past Medical History:  Diagnosis Date  . Abnormal Pap smear of cervix   . Anxiety   . Depression     Past Surgical History:  Past Surgical History:  Procedure Laterality Date  . BREAST SURGERY  03/2018   Right breast biopsy/ Malignant  . COLPOSCOPY  2004   WNL @ [facility]  . Mirena  2008  . MOHS SURGERY  2018   precancerous  . WISDOM TOOTH EXTRACTION      Gynecologic History: No LMP recorded. (Menstrual status: IUD).  Obstetric History: [id number]  Family History:  Family History  Problem Relation Age of Onset  . Kidney disease Mother   . Kidney disease Maternal Grandmother   . Heart disease Maternal Grandfather   . Healthy Father   . Breast cancer Neg Hx     Social History:  Social History   Socioeconomic  History  . Marital status: Married    Spouse name: Not on file  . Number of children: Not on file  . Years of education: Not on file  . Highest education level: Not on file  Occupational History  . Not on file  Social Needs  . Financial resource strain: Not on file  . Food insecurity    Worry: Not on file    Inability: Not on file  . Transportation needs    Medical: Not on file    Non-medical: Not on file  Tobacco Use  . Smoking status: Never Smoker  . Smokeless tobacco: Never Used  Substance and Sexual Activity  . Alcohol use: Yes    Comment: social  . Drug use: Never  . Sexual activity: Yes    Partners: Male    Birth control/protection: I.U.D.  Lifestyle  . Physical activity    Days per week: Not on file    Minutes per session: Not on file  . Stress: Not on file  Relationships  . Social Herbalist on phone: Not on file    Gets together: Not on file    Attends religious service: Not on file    Active member of club or organization: Not on file    Attends meetings of clubs or organizations: Not on file    Relationship status: Not on file  . Intimate partner violence  Fear of current or ex partner: Not on file    Emotionally abused: Not on file    Physically abused: Not on file    Forced sexual activity: Not on file  Other Topics Concern  . Not on file  Social History Narrative  . Not on file    Allergies:  No Known Allergies  Medications: Prior to Admission medications   Medication Sig Start Date End Date Taking? Authorizing Provider  buPROPion (WELLBUTRIN XL) 300 MG 24 hr tablet TAKE 1 TABLET BY MOUTH EVERY DAY 03/22/17   [provider]  FLUoxetine (PROZAC) 20 MG capsule TAKE 1 CAPSULE BY MOUTH EVERY DAY 02/15/17   [provider]  levonorgestrel (MIRENA) 20 MCG/24HR IUD by Intrauterine route.    [provider]  ORACEA 40 MG capsule Take 40 mg by mouth daily. 11/19/17   [provider]  phentermine (ADIPEX-P)  37.5 MG tablet Take 1 tablet (37.5 mg total) by mouth daily before breakfast. 07/04/18   Vena AustriaStaebler, Waylon Hershey, MD    Physical Exam There were no vitals taken for this visit. Wt Readings from Last 3 Encounters:  08/03/18 176 lb (79.8 kg)  07/04/18 175 lb (79.4 kg)  05/31/18 181 lb (82.1 kg)  175Lbs  No physical exam as this was a remote telephone visit to promote social distancing during the current COVID-19 Pandemic  Assessment: 43 y.o. [id number] No problem-specific Assessment & Plan notes found for this encounter.   Plan: Problem List Items Addressed This Visit    None    Visit Diagnoses    Overweight (BMI 25.0-29.9)    -  Primary   BMI 27.0-27.9,adult          1) 1500 Calorie ADA Diet  2) Patient education given regarding appropriate lifestyle changes for weight loss including: regular physical activity, healthy coping strategies, caloric restriction and healthy eating patterns.  3) Patient will be started on weight loss medication. The risks and benefits and side effects of medication, such as Adipex (Phenteramine) ,  Tenuate (Diethylproprion), Belviq (lorcarsin), Contrave (buproprion/naltrexone), Qsymia (phentermine/topiramate), and Saxenda (liraglutide) is discussed. The pros and cons of suppressing appetite and boosting metabolism is discussed. Risks of tolerence and addiction is discussed for selected agents discussed. Use of medicine will ne short term, such as 3-4 months at a time followed by a period of time off of the medicine to avoid these risks and side effects for Adipex, Qsymia, and Tenuate discussed. Pt to call with any negative side effects and agrees to keep follow up appts.  4) Patient to take medication, with the benefits of appetite suppression and metabolism boost d/w pt, along with the side effects and risk factors of long term use that will be avoided with our use of short bursts of therapy. Rx provided.    5) Telephone time 5 min 30 seconds  6)  Return in about  4 weeks (around 12/05/2018) for medication follow up phone.    Vena AustriaAndreas Emberli Ballester, MD, Merlinda FrederickFACOG [facility], [facility] 11/07/2018, 4:01 PM

## 2018-11-21 DIAGNOSIS — Z1281 Encounter for screening for malignant neoplasm of oral cavity: Secondary | ICD-10-CM | POA: Diagnosis not present

## 2018-11-21 DIAGNOSIS — K0500 Acute gingivitis, plaque induced: Secondary | ICD-10-CM | POA: Diagnosis not present

## 2018-12-04 ENCOUNTER — Ambulatory Visit (INDEPENDENT_AMBULATORY_CARE_PROVIDER_SITE_OTHER): Payer: BLUE CROSS/BLUE SHIELD | Admitting: Obstetrics and Gynecology

## 2018-12-04 ENCOUNTER — Other Ambulatory Visit: Payer: Self-pay

## 2018-12-04 VITALS — Ht 67.0 in | Wt 172.0 lb

## 2018-12-04 DIAGNOSIS — Z6826 Body mass index (BMI) 26.0-26.9, adult: Secondary | ICD-10-CM

## 2018-12-04 DIAGNOSIS — E663 Overweight: Secondary | ICD-10-CM

## 2018-12-04 MED ORDER — PHENTERMINE HCL 37.5 MG PO TABS: 37.5000 mg | ORAL_TABLET | Freq: Every day | ORAL | 0 refills | Status: DC

## 2018-12-04 NOTE — Progress Notes (Signed)
I connected with Dana Fischer on 12/04/18 at  2:30 PM EDT by telephone and verified that I am speaking with the correct person using two identifiers.   I discussed the limitations, risks, security and privacy concerns of performing an evaluation and management service by telephone and the availability of in person appointments. I also discussed with the patient that there may be a patient responsible charge related to this service. The patient expressed understanding and agreed to proceed.  The patient was at home I spoke with the patient from my workstation phone The names of people involved in this encounter were: Meggen Sharl MaMarty , and Vena AustriaAndreas Harlow Carrizales   Gynecology Office Visit  Chief Complaint:  Chief Complaint  Patient presents with  . Weight Check    History of Present Illness: Patientis a 43 y.o. [id number] female, who presents for the evaluation of the desire to lose weight. She has lost 5lbs pounds 1 months. The patient states the following symptoms since starting her weight loss therapy: appetite suppression, energy, and weight loss.  The patient also reports no other ill effects. The patient specifically denies heart palpitations, anxiety, and insomnia.    Review of Systems: 10 point review of systems negative unless otherwise noted in HPI  Past Medical History:  Past Medical History:  Diagnosis Date  . Abnormal Pap smear of cervix   . Anxiety   . Depression     Past Surgical History:  Past Surgical History:  Procedure Laterality Date  . BREAST SURGERY  03/2018   Right breast biopsy/ Malignant  . COLPOSCOPY  2004   WNL @ [facility]  . Mirena  2008  . MOHS SURGERY  2018   precancerous  . WISDOM TOOTH EXTRACTION      Gynecologic History: No LMP recorded. (Menstrual status: IUD).  Obstetric History: [id number]  Family History:  Family History  Problem Relation Age of Onset  . Kidney disease Mother   . Kidney disease Maternal Grandmother   . Heart disease  Maternal Grandfather   . Healthy Father   . Breast cancer Neg Hx     Social History:  Social History   Socioeconomic History  . Marital status: Married    Spouse name: Not on file  . Number of children: Not on file  . Years of education: Not on file  . Highest education level: Not on file  Occupational History  . Not on file  Social Needs  . Financial resource strain: Not on file  . Food insecurity    Worry: Not on file    Inability: Not on file  . Transportation needs    Medical: Not on file    Non-medical: Not on file  Tobacco Use  . Smoking status: Never Smoker  . Smokeless tobacco: Never Used  Substance and Sexual Activity  . Alcohol use: Yes    Comment: social  . Drug use: Never  . Sexual activity: Yes    Partners: Male    Birth control/protection: I.U.D.  Lifestyle  . Physical activity    Days per week: Not on file    Minutes per session: Not on file  . Stress: Not on file  Relationships  . Social Musicianconnections    Talks on phone: Not on file    Gets together: Not on file    Attends religious service: Not on file    Active member of club or organization: Not on file    Attends meetings of clubs or organizations: Not on  file    Relationship status: Not on file  . Intimate partner violence    Fear of current or ex partner: Not on file    Emotionally abused: Not on file    Physically abused: Not on file    Forced sexual activity: Not on file  Other Topics Concern  . Not on file  Social History Narrative  . Not on file    Allergies:  No Known Allergies  Medications: Prior to Admission medications   Medication Sig Start Date End Date Taking? Authorizing Provider  buPROPion (WELLBUTRIN XL) 300 MG 24 hr tablet TAKE 1 TABLET BY MOUTH EVERY DAY 03/22/17  Yes [provider]  FLUoxetine (PROZAC) 20 MG capsule TAKE 1 CAPSULE BY MOUTH EVERY DAY 02/15/17  Yes [provider]  levonorgestrel (MIRENA) 20 MCG/24HR IUD by Intrauterine route.   Yes  [provider]  ORACEA 40 MG capsule Take 40 mg by mouth daily. 11/19/17  Yes [provider]  phentermine (ADIPEX-P) 37.5 MG tablet Take 1 tablet (37.5 mg total) by mouth daily before breakfast. 11/07/18  Yes [name], MD    Physical Exam Height 5\' 7"  (1.702 m), weight 172 lb (78 kg). Wt Readings from Last 3 Encounters:  12/04/18 172 lb (78 kg)  08/03/18 176 lb (79.8 kg)  07/04/18 175 lb (79.4 kg)  Body mass index is 26.94 kg/m.  No physical exam as this was a remote telephone visit to promote social distancing during the current COVID-19 Pandemic   Assessment: 43 y.o. [id number]  Plan: Problem List Items Addressed This Visit    None    Visit Diagnoses    Overweight (BMI 25.0-29.9)    -  Primary   BMI 26.0-26.9,adult          1) 1500 Calorie ADA Diet  2) Patient education given regarding appropriate lifestyle changes for weight loss including: regular physical activity, healthy coping strategies, caloric restriction and healthy eating patterns.  3) Continue phentermine  4) Patient to take medication, with the benefits of appetite suppression and metabolism boost d/w pt, along with the side effects and risk factors of long term use that will be avoided with our use of short bursts of therapy. Rx provided.    5) Telephone time 5 minutes  6)  Return in about 4 weeks (around 01/01/2019) for medication follow up.   [name], MD, [facility], [facility] 12/04/2018, 2:40 PM

## 2019-01-01 ENCOUNTER — Other Ambulatory Visit: Payer: Self-pay

## 2019-01-01 ENCOUNTER — Encounter: Payer: Self-pay | Admitting: Obstetrics and Gynecology

## 2019-01-01 ENCOUNTER — Ambulatory Visit (INDEPENDENT_AMBULATORY_CARE_PROVIDER_SITE_OTHER): Payer: BLUE CROSS/BLUE SHIELD | Admitting: Obstetrics and Gynecology

## 2019-01-01 VITALS — Wt 168.0 lb

## 2019-01-01 DIAGNOSIS — Z1239 Encounter for other screening for malignant neoplasm of breast: Secondary | ICD-10-CM | POA: Diagnosis not present

## 2019-01-01 DIAGNOSIS — Z6826 Body mass index (BMI) 26.0-26.9, adult: Secondary | ICD-10-CM | POA: Diagnosis not present

## 2019-01-01 DIAGNOSIS — E663 Overweight: Secondary | ICD-10-CM | POA: Diagnosis not present

## 2019-01-01 MED ORDER — PHENTERMINE HCL 37.5 MG PO TABS: 37.5000 mg | ORAL_TABLET | Freq: Every day | ORAL | 0 refills | Status: DC

## 2019-01-01 NOTE — Progress Notes (Signed)
I connected with Dana Fischer on 01/01/19 at  1:50 PM EDT by telephone and verified that I am speaking with the correct person using two identifiers.   I discussed the limitations, risks, security and privacy concerns of performing an evaluation and management service by telephone and the availability of in person appointments. I also discussed with the patient that there may be a patient responsible charge related to this service. The patient expressed understanding and agreed to proceed.  The patient was at home.  I spoke with the patient from my workstation phone.  The names of people involved in this encounter were: Dana Fischer , and Dana Fischer   Gynecology Office Visit  Chief Complaint:  Chief Complaint  Patient presents with  . Weight Check    History of Present Illness: Patientis a 43 y.o. [id number] female, who presents for the evaluation of the desire to lose weight. She has lost 4 pounds 1 months, 8lbs 2 month weight loss total. The patient states the following symptoms since starting her weight loss therapy: appetite suppression, energy, and weight loss.  The patient also reports no other ill effects. The patient specifically denies heart palpitations, anxiety, and insomnia.    Review of Systems: 10 point review of systems negative unless otherwise noted in HPI  Past Medical History:  Past Medical History:  Diagnosis Date  . Abnormal Pap smear of cervix   . Anxiety   . Depression     Past Surgical History:  Past Surgical History:  Procedure Laterality Date  . BREAST SURGERY  03/2018   Right breast biopsy/ Malignant  . COLPOSCOPY  2004   WNL @ [facility]  . Mirena  2008  . MOHS SURGERY  2018   precancerous  . WISDOM TOOTH EXTRACTION      Gynecologic History: No LMP recorded (lmp unknown). (Menstrual status: IUD).  Obstetric History: [id number]  Family History:  Family History  Problem Relation Age of Onset  . Kidney disease Mother   . Kidney  disease Maternal Grandmother   . Heart disease Maternal Grandfather   . Healthy Father   . Breast cancer Neg Hx     Social History:  Social History   Socioeconomic History  . Marital status: Married    Spouse name: Not on file  . Number of children: Not on file  . Years of education: Not on file  . Highest education level: Not on file  Occupational History  . Not on file  Social Needs  . Financial resource strain: Not on file  . Food insecurity    Worry: Not on file    Inability: Not on file  . Transportation needs    Medical: Not on file    Non-medical: Not on file  Tobacco Use  . Smoking status: Never Smoker  . Smokeless tobacco: Never Used  Substance and Sexual Activity  . Alcohol use: Yes    Comment: social  . Drug use: Never  . Sexual activity: Yes    Partners: Male    Birth control/protection: I.U.D.  Lifestyle  . Physical activity    Days per week: Not on file    Minutes per session: Not on file  . Stress: Not on file  Relationships  . Social Musician on phone: Not on file    Gets together: Not on file    Attends religious service: Not on file    Active member of club or organization: Not on file  Attends meetings of clubs or organizations: Not on file    Relationship status: Not on file  . Intimate partner violence    Fear of current or ex partner: Not on file    Emotionally abused: Not on file    Physically abused: Not on file    Forced sexual activity: Not on file  Other Topics Concern  . Not on file  Social History Narrative  . Not on file    Allergies:  No Known Allergies  Medications: Prior to Admission medications   Medication Sig Start Date End Date Taking? Authorizing Provider  buPROPion (WELLBUTRIN XL) 300 MG 24 hr tablet TAKE 1 TABLET BY MOUTH EVERY DAY 03/22/17  Yes [provider]  FLUoxetine (PROZAC) 20 MG capsule TAKE 1 CAPSULE BY MOUTH EVERY DAY 02/15/17  Yes [provider]  levonorgestrel (MIRENA)  20 MCG/24HR IUD by Intrauterine route.   Yes [provider]  ORACEA 40 MG capsule Take 40 mg by mouth daily. 11/19/17  Yes [provider]  phentermine (ADIPEX-P) 37.5 MG tablet Take 1 tablet (37.5 mg total) by mouth daily before breakfast. 12/04/18  Yes [name], MD    Physical Exam Weight 168 lb (76.2 kg). Wt Readings from Last 3 Encounters:  01/01/19 168 lb (76.2 kg)  12/04/18 172 lb (78 kg)  08/03/18 176 lb (79.8 kg)  Body mass index is 26.31 kg/m.  No physical exam as this was a remote telephone visit to promote social distancing during the current COVID-19 Pandemic  Assessment: 43 y.o. [id number] obesity follow up  Plan: Problem List Items Addressed This Visit    None    Visit Diagnoses    Overweight (BMI 25.0-29.9)    -  Primary   BMI 26.0-26.9,adult       Breast screening       Relevant Orders   MM 3D SCREEN BREAST BILATERAL      1) 1500 Calorie ADA Diet  2) Patient education given regarding appropriate lifestyle changes for weight loss including: regular physical activity, healthy coping strategies, caloric restriction and healthy eating patterns.  3) Patient will be started on weight loss medication. The risks and benefits and side effects of medication, such as Adipex (Phenteramine) ,  Tenuate (Diethylproprion), Belviq (lorcarsin), Contrave (buproprion/naltrexone), Qsymia (phentermine/topiramate), and Saxenda (liraglutide) is discussed. The pros and cons of suppressing appetite and boosting metabolism is discussed. Risks of tolerence and addiction is discussed for selected agents discussed. Use of medicine will ne short term, such as 3-4 months at a time followed by a period of time off of the medicine to avoid these risks and side effects for Adipex, Qsymia, and Tenuate discussed. Pt to call with any negative side effects and agrees to keep follow up appts.  4) Patient to take medication, with the benefits of appetite suppression and metabolism  boost d/w pt, along with the side effects and risk factors of long term use that will be avoided with our use of short bursts of therapy. Rx provided.    5) Telephone 5:00  6) Return in about 2 months (around 03/03/2019) for Annual.     [name], MD, [facility] 01/01/2019, 2:07 PM

## 2019-02-13 ENCOUNTER — Ambulatory Visit
Admission: RE | Admit: 2019-02-13 | Discharge: 2019-02-13 | Disposition: A | Discharge status: Home / Self Care | Payer: BLUE CROSS/BLUE SHIELD | Source: Ambulatory Visit | Attending: Obstetrics and Gynecology | Admitting: Obstetrics and Gynecology

## 2019-02-13 ENCOUNTER — Other Ambulatory Visit: Payer: Self-pay

## 2019-02-13 DIAGNOSIS — Z1231 Encounter for screening mammogram for malignant neoplasm of breast: Secondary | ICD-10-CM | POA: Diagnosis not present

## 2019-02-13 DIAGNOSIS — Z1239 Encounter for other screening for malignant neoplasm of breast: Secondary | ICD-10-CM | POA: Diagnosis not present

## 2019-03-01 ENCOUNTER — Other Ambulatory Visit: Payer: Self-pay

## 2019-03-01 ENCOUNTER — Ambulatory Visit: Payer: BLUE CROSS/BLUE SHIELD | Attending: Internal Medicine

## 2019-03-01 DIAGNOSIS — Z20828 Contact with and (suspected) exposure to other viral communicable diseases: Secondary | ICD-10-CM | POA: Diagnosis not present

## 2019-03-01 DIAGNOSIS — Z20822 Contact with and (suspected) exposure to covid-19: Secondary | ICD-10-CM

## 2019-03-02 LAB — NOVEL CORONAVIRUS, NAA: SARS-CoV-2, NAA: NOT DETECTED

## 2019-12-27 IMAGING — MG DIGITAL DIAGNOSTIC UNILATERAL RIGHT MAMMOGRAM WITH TOMO AND CAD
4 series · 4 of 12 positions shown · non-contrast
Comparison: January 25, 2018 and May 01, 2015

CLINICAL DATA: 42-year-old patient recalled from recent screening
mammogram for evaluation of a possible mass in the upper outer right
breast.

EXAM:
DIGITAL DIAGNOSTIC RIGHT MAMMOGRAM WITH TOMO
ULTRASOUND RIGHT BREAST

[R CC synth-2D]
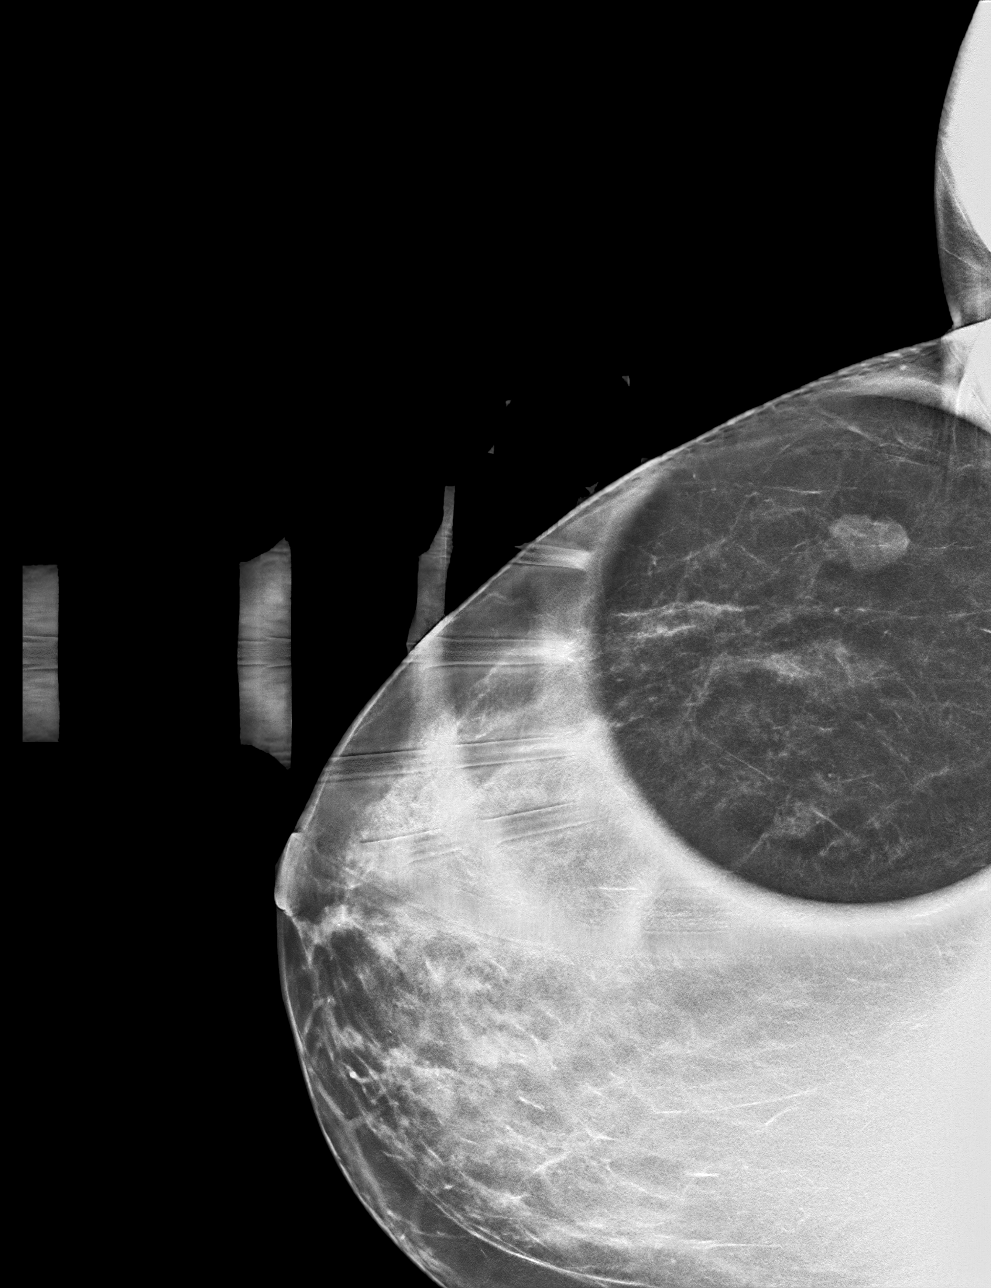

[R MLO synth-2D]
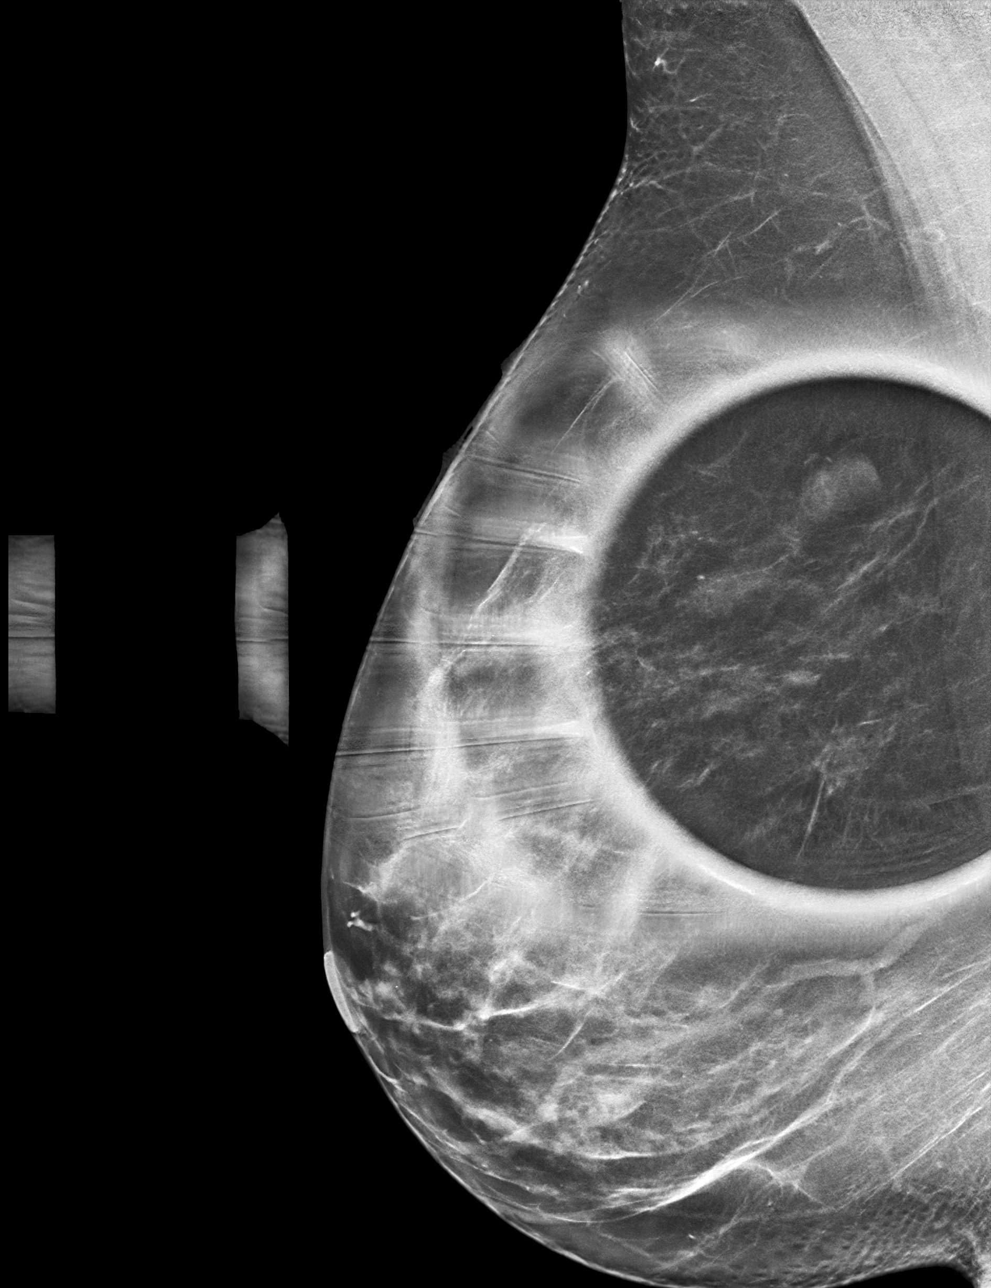

[R MLO tomo · tomo slice 34/67.0]
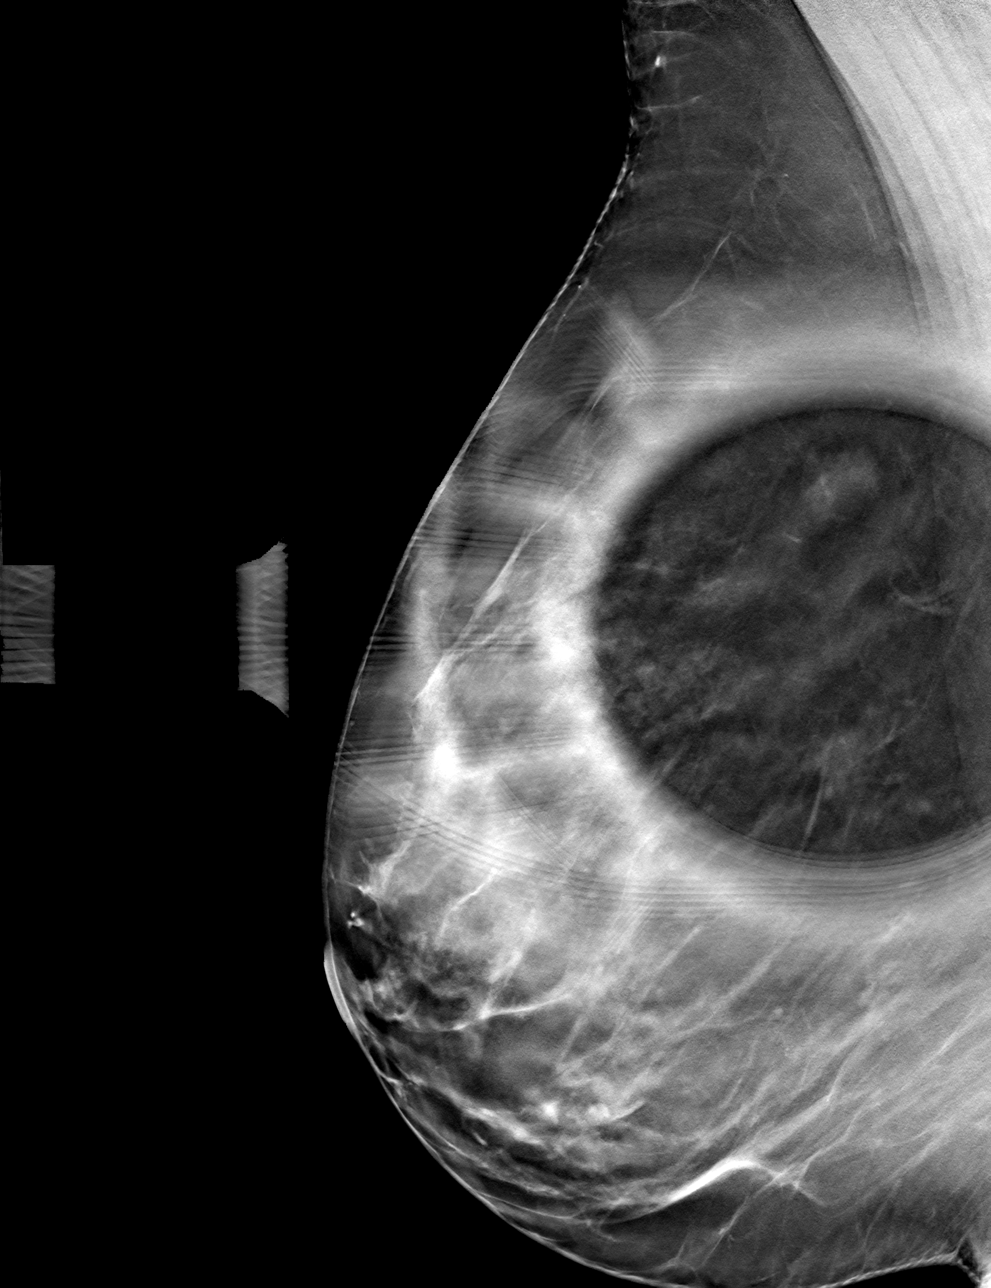

[R CC tomo · tomo slice 33/65.0]
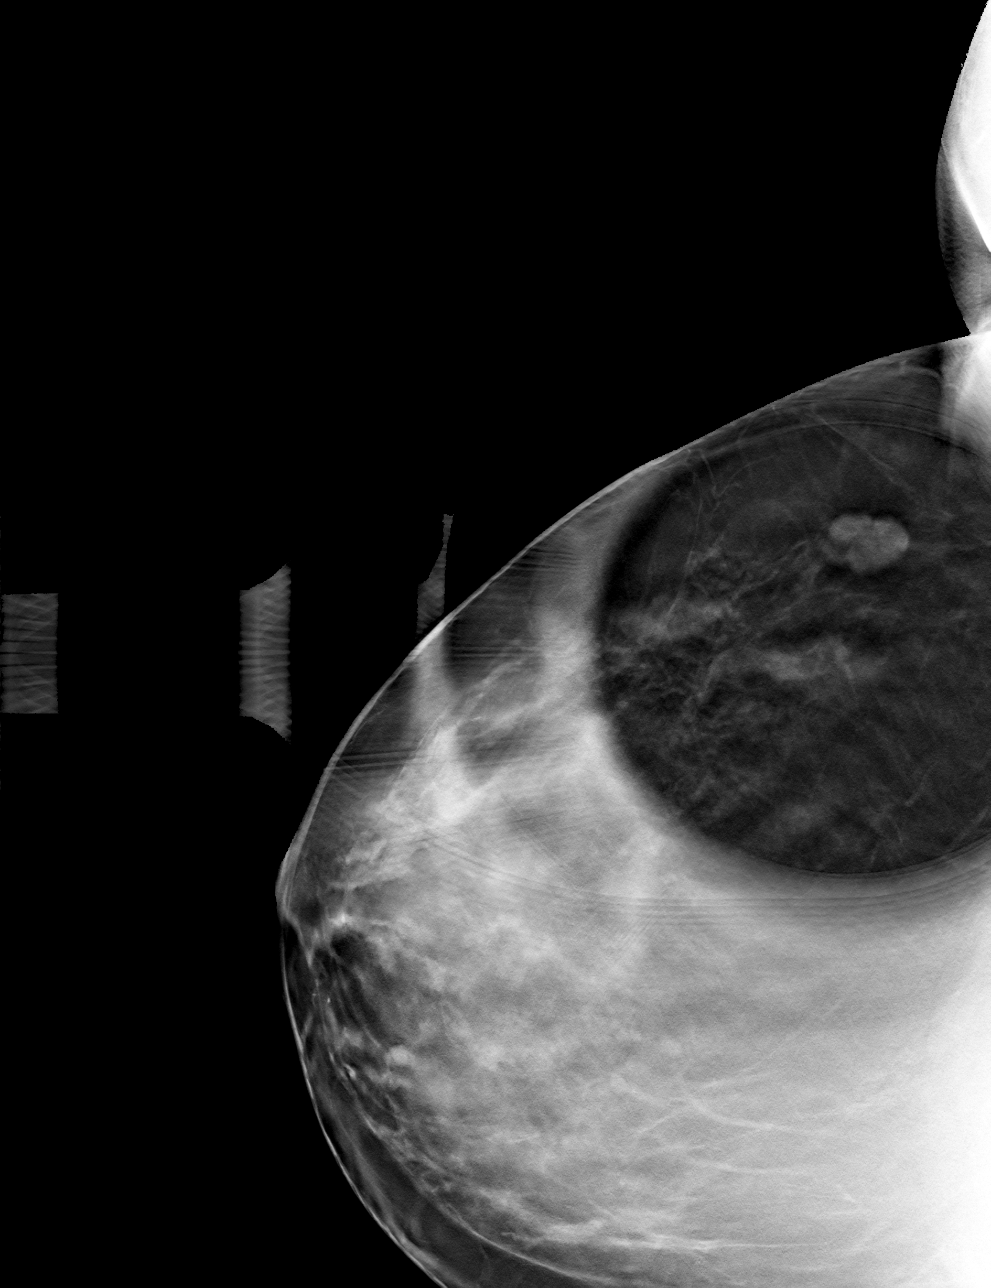

[4 of 12 positions shown; findings below may reference images not displayed]

ACR Breast Density Category c: The breast tissue is heterogeneously
dense, which may obscure small masses.
FINDINGS: Spot compression views with tomography of the upper outer right
breast confirm a circumscribed oval and lobulated mass in the
posterior third of the upper-outer quadrant. This mass is new
compared to the mammogram April 2015.

On physical exam, I do palpate a smooth approximately 1.5 cm lump in
the 10 o'clock position of the right breast axillary tail
approximately 10 cm from the nipple. Patient is able to palpate the
lump today, but has never noticed it previously.

Targeted ultrasound is performed, showing an oval, lobulated,
parallel hypoechoic mass measuring 1.2 x 0.5 x 1.3 cm at 10 o'clock
position 10 cm from the nipple. Ultrasound of the right axilla shows
normal axillary lymph nodes.
IMPRESSION: 1.3 cm solid mass 10 o'clock position of the right breast 10 cm from
the nipple. While this may be a benign fibroadenoma,
ultrasound-guided core needle biopsy is recommended to exclude
malignancy.

RECOMMENDATION:
Ultrasound-guided core needle biopsy of the right breast is
recommended and will be scheduled for the patient.

I have discussed the findings and recommendations with the patient.
Results were also provided in writing at the conclusion of the
visit. If applicable, a reminder letter will be sent to the patient
regarding the next appointment.

BI-RADS CATEGORY  4: Suspicious.

## 2020-01-05 IMAGING — MG MM CLIP PLACEMENT
2 series · 2 of 2 positions shown · non-contrast
Comparison: Previous exam(s).

CLINICAL DATA: Post biopsy mammogram the right breast for
placement.

EXAM:
DIAGNOSTIC RIGHT MAMMOGRAM POST ULTRASOUND BIOPSY

[R CC]
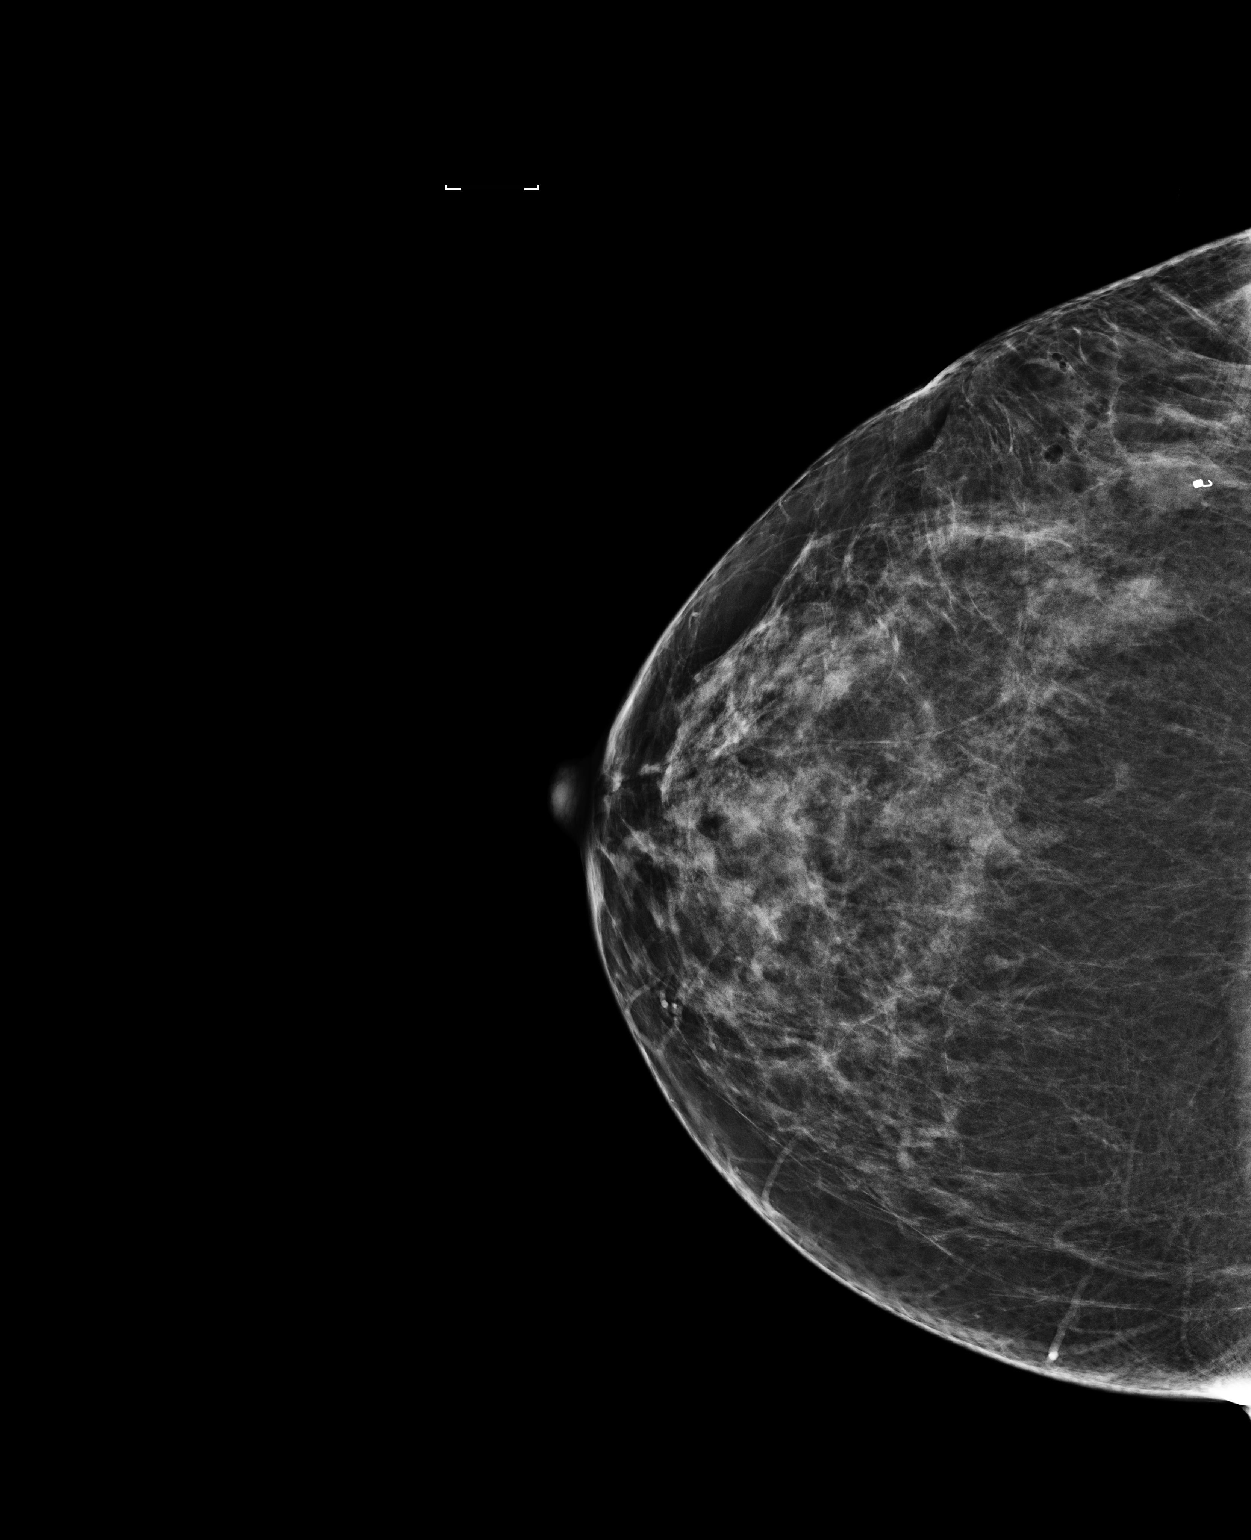

[R ML]
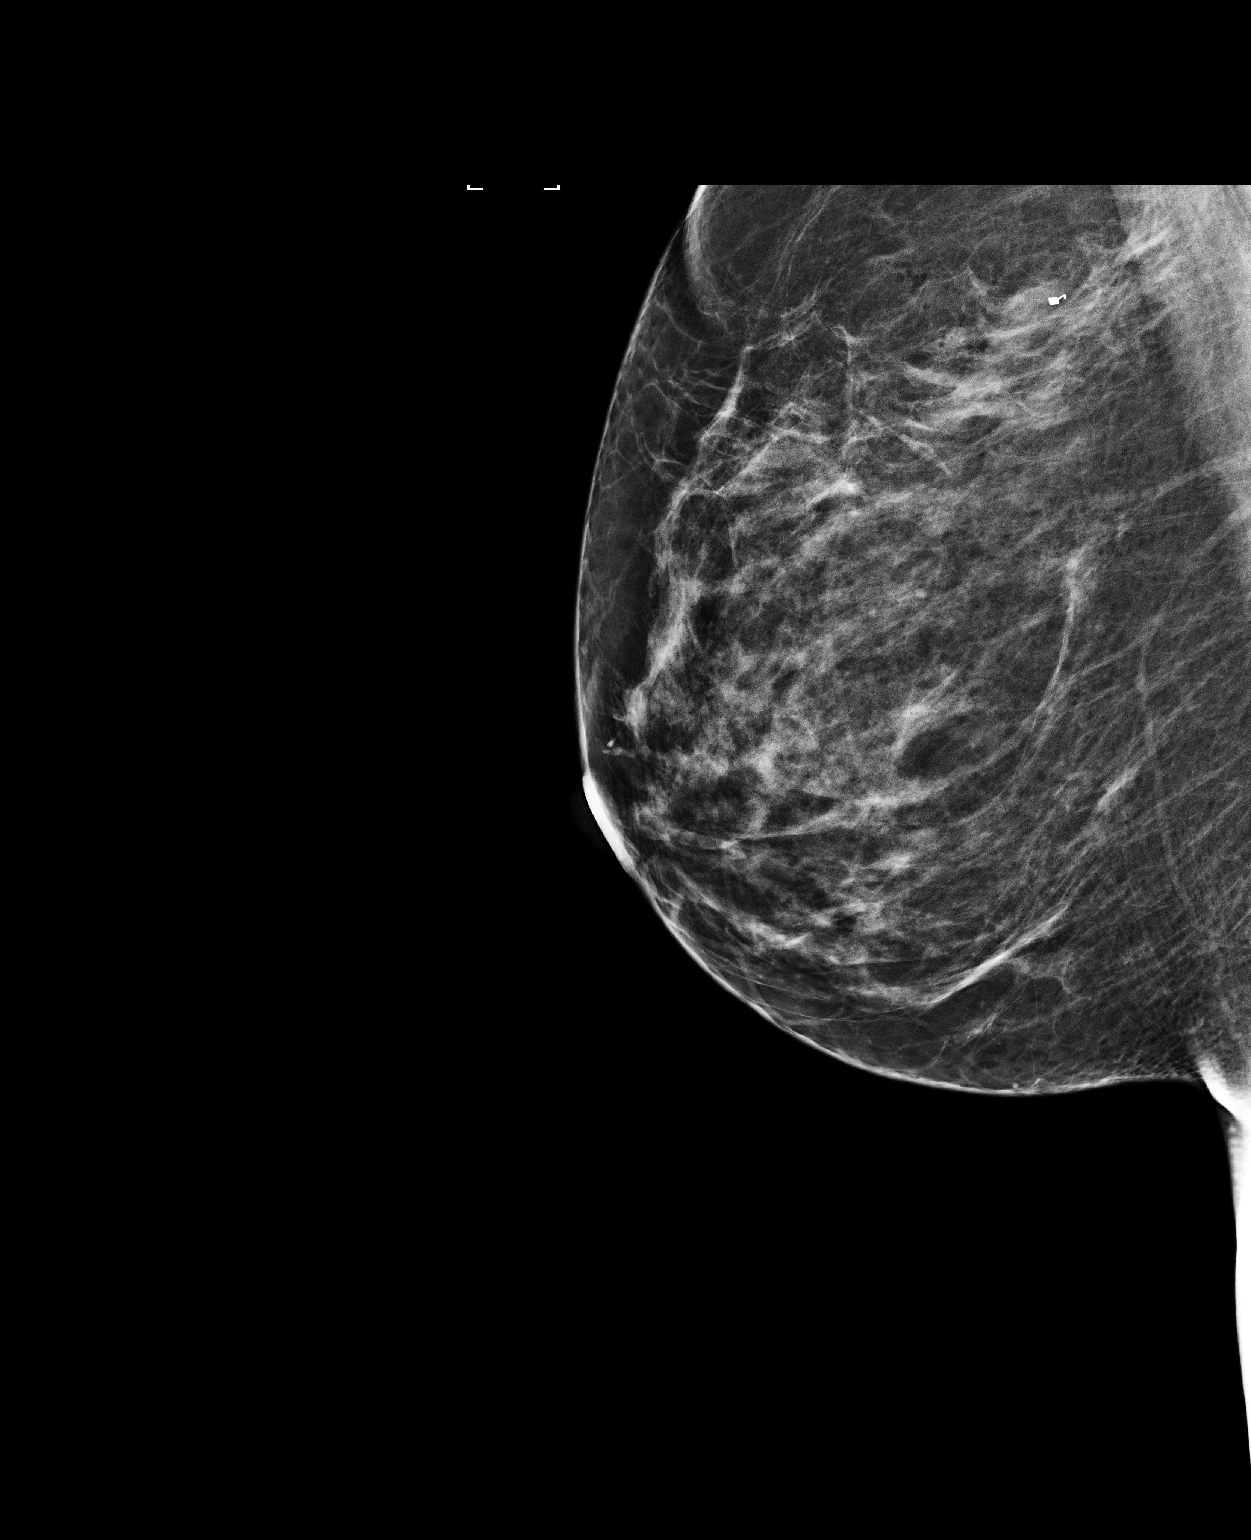

[2 of 2 positions shown; findings below may reference images not displayed]

FINDINGS: Mammographic images were obtained following ultrasound guided biopsy
of a mass in the right breast at 10 o'clock. The coil shaped biopsy
marking clip is well positioned within the mass at 10 o'clock right
breast.
IMPRESSION: Appropriate positioning of coil shaped biopsy marking clip in the
right breast at 10 o'clock.

Final Assessment: Post Procedure Mammograms for Marker Placement

## 2020-02-29 ENCOUNTER — Other Ambulatory Visit: Payer: Self-pay | Admitting: Obstetrics and Gynecology

## 2020-02-29 ENCOUNTER — Telehealth: Payer: Self-pay

## 2020-02-29 NOTE — Telephone Encounter (Signed)
Patient received letter to schedule her mammogram. She needs order prior to scheduling. [phone]

## 2020-03-01 ENCOUNTER — Other Ambulatory Visit: Payer: Self-pay | Admitting: Obstetrics and Gynecology

## 2020-03-01 DIAGNOSIS — Z1239 Encounter for other screening for malignant neoplasm of breast: Secondary | ICD-10-CM

## 2020-03-11 ENCOUNTER — Other Ambulatory Visit: Payer: Self-pay

## 2020-03-11 ENCOUNTER — Ambulatory Visit
Admission: RE | Admit: 2020-03-11 | Discharge: 2020-03-11 | Disposition: A | Discharge status: Home / Self Care | Payer: 59 | Source: Ambulatory Visit | Attending: Obstetrics and Gynecology | Admitting: Obstetrics and Gynecology

## 2020-03-11 DIAGNOSIS — Z1231 Encounter for screening mammogram for malignant neoplasm of breast: Secondary | ICD-10-CM | POA: Diagnosis present

## 2020-03-11 DIAGNOSIS — Z1239 Encounter for other screening for malignant neoplasm of breast: Secondary | ICD-10-CM

## 2020-03-12 ENCOUNTER — Other Ambulatory Visit: Payer: Self-pay | Admitting: Obstetrics and Gynecology

## 2020-03-12 DIAGNOSIS — R928 Other abnormal and inconclusive findings on diagnostic imaging of breast: Secondary | ICD-10-CM

## 2020-03-12 DIAGNOSIS — N6310 Unspecified lump in the right breast, unspecified quadrant: Secondary | ICD-10-CM

## 2020-03-13 NOTE — Telephone Encounter (Signed)
Spoke with patient via phone regarding mammogram results showing BI-RAD 0 with additional imaging of the right breast requested.  Orders co-signed.

## 2020-03-19 ENCOUNTER — Ambulatory Visit
Admission: RE | Admit: 2020-03-19 | Discharge: 2020-03-19 | Disposition: A | Discharge status: Home / Self Care | Payer: 59 | Source: Ambulatory Visit | Attending: Obstetrics and Gynecology | Admitting: Obstetrics and Gynecology

## 2020-03-19 ENCOUNTER — Other Ambulatory Visit: Payer: Self-pay

## 2020-03-19 DIAGNOSIS — N6310 Unspecified lump in the right breast, unspecified quadrant: Secondary | ICD-10-CM | POA: Insufficient documentation

## 2020-03-19 DIAGNOSIS — R928 Other abnormal and inconclusive findings on diagnostic imaging of breast: Secondary | ICD-10-CM | POA: Insufficient documentation

## 2020-04-08 NOTE — Telephone Encounter (Signed)
AMS placed/signed order & notified pt via my chart 03/01/20

## 2020-07-07 ENCOUNTER — Other Ambulatory Visit: Payer: Self-pay

## 2020-07-07 ENCOUNTER — Encounter: Payer: Self-pay | Admitting: Emergency Medicine

## 2020-07-07 ENCOUNTER — Ambulatory Visit
Admission: EM | Admit: 2020-07-07 | Discharge: 2020-07-07 | Disposition: A | Discharge status: Home / Self Care | Payer: 59 | Attending: Physician Assistant | Admitting: Physician Assistant

## 2020-07-07 DIAGNOSIS — J069 Acute upper respiratory infection, unspecified: Secondary | ICD-10-CM | POA: Diagnosis not present

## 2020-07-07 DIAGNOSIS — J029 Acute pharyngitis, unspecified: Secondary | ICD-10-CM | POA: Diagnosis not present

## 2020-07-07 DIAGNOSIS — R0981 Nasal congestion: Secondary | ICD-10-CM

## 2020-07-07 DIAGNOSIS — R059 Cough, unspecified: Secondary | ICD-10-CM | POA: Diagnosis not present

## 2020-07-07 MED ORDER — PROMETHAZINE-DM 6.25-15 MG/5ML PO SYRP: 5.0000 mL | ORAL_SOLUTION | Freq: Four times a day (QID) | ORAL | 0 refills | Status: DC | PRN

## 2020-07-07 MED ORDER — IPRATROPIUM BROMIDE 0.06 % NA SOLN: 2.0000 | Freq: Four times a day (QID) | NASAL | 12 refills | Status: DC

## 2020-07-07 NOTE — ED Provider Notes (Signed)
[facility]    CSN: [id number] Arrival date & time: 07/07/20  1004      History   Chief Complaint Chief Complaint  Patient presents with  . Fever  . Cough  . Sore Throat    HPI Dana Fischer is a 45 y.o. female presenting for ~ 1 week history of fatigue, cough, and congestion.  Patient says that she started to have a fever greater than 100 degrees 3 days ago.  Admits to temperatures up to 101.8 degrees.  Patient has been taking Tylenol and Motrin for fever.  She says that her daughter was recently sick with similar symptoms but got better within a couple of days.  Patient concerned about possible sinus infection given the fever.  She states that she did take 2 COVID tests at home which were both negative.  Her daughter was never tested for COVID or influenza.  Patient denies any known COVID or influenza exposure and also denies any strep exposure.  Patient fully vaccinated for influenza and COVID-19.  She is also been taking OTC antihistamines and Mucinex as well as using Flonase.  Patient says symptoms seem to be getting worse and not better.  She is otherwise healthy without any history of acute cardiopulmonary disease.  She has no other complaints or concerns today.  HPI  Past Medical History:  Diagnosis Date  . Abnormal Pap smear of cervix   . Anxiety   . Depression     There are no problems to display for this patient.   Past Surgical History:  Procedure Laterality Date  . BREAST BIOPSY Right 2019   benign bx/clip  . BREAST SURGERY  03/2018   Right breast biopsy/ Malignant  . COLPOSCOPY  2004   WNL @ [facility]  . Mirena  2008  . MOHS SURGERY  2018   precancerous  . WISDOM TOOTH EXTRACTION      OB History    Gravida  3   Para  2   Term  2   Preterm      AB  1   Living  2     SAB  1   IAB      Ectopic      Multiple      Live Births  2            Home Medications    Prior to Admission medications   Medication Sig  Start Date End Date Taking? Authorizing Provider  ipratropium (ATROVENT) 0.06 % nasal spray Place 2 sprays into both nostrils 4 (four) times daily. 07/07/20  Yes Shirlee Latch, PA-C  promethazine-dextromethorphan (PROMETHAZINE-DM) 6.25-15 MG/5ML syrup Take 5 mLs by mouth 4 (four) times daily as needed for cough. 07/07/20  Yes Eusebio Friendly B, PA-C  buPROPion (WELLBUTRIN XL) 300 MG 24 hr tablet TAKE 1 TABLET BY MOUTH EVERY DAY 03/22/17   [provider]  FLUoxetine (PROZAC) 20 MG capsule TAKE 1 CAPSULE BY MOUTH EVERY DAY 02/15/17   [provider]  levonorgestrel (MIRENA) 20 MCG/24HR IUD by Intrauterine route.    [provider]  ORACEA 40 MG capsule Take 40 mg by mouth daily. 11/19/17   [provider]  phentermine (ADIPEX-P) 37.5 MG tablet Take 1 tablet (37.5 mg total) by mouth daily before breakfast. 01/01/19   [name], MD    Family History Family History  Problem Relation Age of Onset  . Kidney disease Mother   . Kidney disease Maternal Grandmother   . Heart  disease Maternal Grandfather   . Healthy Father   . Breast cancer Neg Hx     Social History Social History   Tobacco Use  . Smoking status: Never Smoker  . Smokeless tobacco: Never Used  Vaping Use  . Vaping Use: Never used  Substance Use Topics  . Alcohol use: Yes    Comment: social  . Drug use: Never     Allergies   Patient has no known allergies.   Review of Systems Review of Systems  Constitutional: Positive for fatigue and fever. Negative for chills and diaphoresis.  HENT: Positive for congestion, rhinorrhea and sore throat. Negative for ear pain, sinus pressure and sinus pain.   Respiratory: Positive for cough. Negative for shortness of breath.   Gastrointestinal: Negative for abdominal pain, nausea and vomiting.  Musculoskeletal: Negative for arthralgias and myalgias.  Skin: Negative for rash.  Neurological: Negative for weakness and headaches.  Hematological:  Negative for adenopathy.     Physical Exam Triage Vital Signs ED Triage Vitals  Enc Vitals Group     BP 07/07/20 1053 121/85     Pulse Rate 07/07/20 1053 87     Resp 07/07/20 1053 18     Temp 07/07/20 1053 98.4 F (36.9 C)     Temp Source 07/07/20 1053 Oral     SpO2 07/07/20 1053 99 %     Weight --      Height --      Head Circumference --      Peak Flow --      Pain Score 07/07/20 1051 3     Pain Loc --      Pain Edu? --      Excl. in GC? --    No data found.  Updated Vital Signs BP 121/85 (BP Location: Left Arm)   Pulse 87   Temp 98.4 F (36.9 C) (Oral)   Resp 18   SpO2 99%      Physical Exam Vitals and nursing note reviewed.  Constitutional:      General: She is not in acute distress.    Appearance: Normal appearance. She is well-developed. She is not ill-appearing or toxic-appearing.  HENT:     Head: Normocephalic and atraumatic.     Nose: Congestion and rhinorrhea (mild clear drainage) present.     Mouth/Throat:     Mouth: Mucous membranes are moist.     Pharynx: Oropharynx is clear. Posterior oropharyngeal erythema (very mild) present.  Eyes:     General: No scleral icterus.       Right eye: No discharge.        Left eye: No discharge.     Conjunctiva/sclera: Conjunctivae normal.  Cardiovascular:     Rate and Rhythm: Normal rate and regular rhythm.     Heart sounds: Normal heart sounds.  Pulmonary:     Effort: Pulmonary effort is normal. No respiratory distress.     Breath sounds: Normal breath sounds. No wheezing, rhonchi or rales.  Musculoskeletal:     Cervical back: Neck supple.  Skin:    General: Skin is dry.  Neurological:     General: No focal deficit present.     Mental Status: She is alert. Mental status is at baseline.     Motor: No weakness.     Gait: Gait normal.  Psychiatric:        Mood and Affect: Mood normal.        Behavior: Behavior normal.  Thought Content: Thought content normal.      UC Treatments / Results   Labs (all labs ordered are listed, but only abnormal results are displayed) Labs Reviewed - No data to display  EKG   Radiology No results found.  Procedures Procedures (including critical care time)  Medications Ordered in UC Medications - No data to display  Initial Impression / Assessment and Plan / UC Course  I have reviewed the triage vital signs and the nursing notes.  Pertinent labs & imaging results that were available during my care of the patient were reviewed by me and considered in my medical decision making (see chart for details).   45 year old female presenting for approximately 1 week history of cough, congestion and sore throat.  Patient mitts to fevers over the past couple of days.  Concerned about possible sinus infection.  All vital signs normal and stable.  She is afebrile in clinic.  She is overall well-appearing.  She does have mild posterior pharyngeal erythema and mild nasal congestion with clear rhinorrhea.  Her chest is clear to auscultation heart regular rate and rhythm.  Offered flu, COVID-19, and strep testing but patient declined.  Patient says she does not think she has any of those illnesses.  Advised her that her symptoms are most consistent with a viral illness and not a sinus infection.  Advised her that I do not believe antibiotics are indicated at this time.  Advised supportive care with increasing rest and fluids.  Advised to continue Tylenol and Motrin for the fevers.  I did send Promethazine DM and Atrovent nasal spray.  Advised to return or see PCP if still having fevers in the next 3 days or any worsening symptoms.  But she should be feeling better over the next 3 to 5 days.  ED precautions reviewed.  Work note given.   Final Clinical Impressions(s) / UC Diagnoses   Final diagnoses:  Viral upper respiratory tract infection  Cough  Nasal congestion  Sore throat     Discharge Instructions     URI/COLD SYMPTOMS: Your exam today is  consistent with a viral illness. Antibiotics are not indicated at this time. Use medications as directed, including cough syrup, nasal saline, and decongestants. Your symptoms should improve over the next few days and resolve within 7-10 days. Increase rest and fluids. F/u if symptoms worsen or predominate such as sore throat, ear pain, productive cough, shortness of breath, or if you develop high fevers or worsening fatigue over the next several days.    You have received COVID testing today either for positive exposure, concerning symptoms that could be related to COVID infection, screening purposes, or re-testing after confirmed positive.  Your test obtained today checks for active viral infection in the last 1-2 weeks. If your test is negative now, you can still test positive later. So, if you do develop symptoms you should either get re-tested and/or isolate x 5 days and then strict mask use x 5 days (unvaccinated) or mask use x 10 days (vaccinated). Please follow CDC guidelines.  While Rapid antigen tests come back in 15-20 minutes, send out PCR/molecular test results typically come back within 1-3 days. In the mean time, if you are symptomatic, assume this could be a positive test and treat/monitor yourself as if you do have COVID.   We will call with test results if positive. Please download the MyChart app and set up a profile to access test results.   If symptomatic, go home and  rest. Push fluids. Take Tylenol as needed for discomfort. Gargle warm salt water. Throat lozenges. Take Mucinex DM or Robitussin for cough. Humidifier in bedroom to ease coughing. Warm showers. Also review the COVID handout for more information.  COVID-19 INFECTION: The incubation period of COVID-19 is approximately 14 days after exposure, with most symptoms developing in roughly 4-5 days. Symptoms may range in severity from mild to critically severe. Roughly 80% of those infected will have mild symptoms. People of any  age may become infected with COVID-19 and have the ability to transmit the virus. The most common symptoms include: fever, fatigue, cough, body aches, headaches, sore throat, nasal congestion, shortness of breath, nausea, vomiting, diarrhea, changes in smell and/or taste.    COURSE OF ILLNESS Some patients may begin with mild disease which can progress quickly into critical symptoms. If your symptoms are worsening please call ahead to the Emergency Department and proceed there for further treatment. Recovery time appears to be roughly 1-2 weeks for mild symptoms and 3-6 weeks for severe disease.   GO IMMEDIATELY TO ER FOR FEVER YOU ARE UNABLE TO GET DOWN WITH TYLENOL, BREATHING PROBLEMS, CHEST PAIN, FATIGUE, LETHARGY, INABILITY TO EAT OR DRINK, ETC  QUARANTINE AND ISOLATION: To help decrease the spread of COVID-19 please remain isolated if you have COVID infection or are highly suspected to have COVID infection. This means -stay home and isolate to one room in the home if you live with others. Do not share a bed or bathroom with others while ill, sanitize and wipe down all countertops and keep common areas clean and disinfected. Stay home for 5 days. If you have no symptoms or your symptoms are resolving after 5 days, you can leave your house. Continue to wear a mask around others for 5 additional days. If you have been in close contact (within 6 feet) of someone diagnosed with COVID 19, you are advised to quarantine in your home for 14 days as symptoms can develop anywhere from 2-14 days after exposure to the virus. If you develop symptoms, you  must isolate.  Most current guidelines for COVID after exposure -unvaccinated: isolate 5 days and strict mask use x 5 days. Test on day 5 is possible -vaccinated: wear mask x 10 days if symptoms do not develop -You do not necessarily need to be tested for COVID if you have + exposure and  develop symptoms. Just isolate at home x10 days from symptom onset During  this global pandemic, CDC advises to practice social distancing, try to stay at least 39ft away from others at all times. Wear a face covering. Wash and sanitize your hands regularly and avoid going anywhere that is not necessary.  KEEP IN MIND THAT THE COVID TEST IS NOT 100% ACCURATE AND YOU SHOULD STILL DO EVERYTHING TO PREVENT POTENTIAL SPREAD OF VIRUS TO OTHERS (WEAR MASK, WEAR GLOVES, WASH HANDS AND SANITIZE REGULARLY). IF INITIAL TEST IS NEGATIVE, THIS MAY NOT MEAN YOU ARE DEFINITELY NEGATIVE. MOST ACCURATE TESTING IS DONE 5-7 DAYS AFTER EXPOSURE.   It is not advised by CDC to get re-tested after receiving a positive COVID test since you can still test positive for weeks to months after you have already cleared the virus.   *If you have not been vaccinated for COVID, I strongly suggest you consider getting vaccinated as long as there are no contraindications.      ED Prescriptions    Medication Sig Dispense Auth. Provider   promethazine-dextromethorphan (PROMETHAZINE-DM) 6.25-15 MG/5ML syrup Take 5  mLs by mouth 4 (four) times daily as needed for cough. 118 mL Eusebio Friendly B, PA-C   ipratropium (ATROVENT) 0.06 % nasal spray Place 2 sprays into both nostrils 4 (four) times daily. 15 mL Shirlee Latch, PA-C     PDMP not reviewed this encounter.   Shirlee Latch, PA-C 07/07/20 1145

## 2020-07-07 NOTE — ED Triage Notes (Signed)
Pt is present today with a sore throat, fever(101.8), and cough. Pt states that sore throat and fever started Friday morning. Pt states that she did an at home Covid teste yesterday and the results were negative

## 2020-07-07 NOTE — Discharge Instructions (Addendum)

## 2020-07-16 ENCOUNTER — Ambulatory Visit: Payer: Self-pay | Admitting: Obstetrics and Gynecology

## 2020-07-24 ENCOUNTER — Other Ambulatory Visit: Payer: Self-pay

## 2020-07-24 ENCOUNTER — Encounter: Payer: Self-pay | Admitting: Obstetrics and Gynecology

## 2020-07-24 ENCOUNTER — Other Ambulatory Visit (HOSPITAL_COMMUNITY)
Admission: RE | Admit: 2020-07-24 | Discharge: 2020-07-24 | Disposition: A | Discharge status: Home / Self Care | Payer: 59 | Source: Ambulatory Visit | Attending: Obstetrics and Gynecology | Admitting: Obstetrics and Gynecology

## 2020-07-24 ENCOUNTER — Ambulatory Visit (INDEPENDENT_AMBULATORY_CARE_PROVIDER_SITE_OTHER): Payer: 59 | Admitting: Obstetrics and Gynecology

## 2020-07-24 VITALS — BP 125/82 | HR 73 | Ht 67.0 in | Wt 192.0 lb

## 2020-07-24 DIAGNOSIS — Z01419 Encounter for gynecological examination (general) (routine) without abnormal findings: Secondary | ICD-10-CM | POA: Diagnosis not present

## 2020-07-24 DIAGNOSIS — Z1239 Encounter for other screening for malignant neoplasm of breast: Secondary | ICD-10-CM | POA: Diagnosis not present

## 2020-07-24 DIAGNOSIS — Z124 Encounter for screening for malignant neoplasm of cervix: Secondary | ICD-10-CM | POA: Insufficient documentation

## 2020-07-24 DIAGNOSIS — Z30431 Encounter for routine checking of intrauterine contraceptive device: Secondary | ICD-10-CM

## 2020-07-24 DIAGNOSIS — Z6830 Body mass index (BMI) 30.0-30.9, adult: Secondary | ICD-10-CM | POA: Diagnosis not present

## 2020-07-24 DIAGNOSIS — Z1211 Encounter for screening for malignant neoplasm of colon: Secondary | ICD-10-CM

## 2020-07-24 DIAGNOSIS — E669 Obesity, unspecified: Secondary | ICD-10-CM | POA: Diagnosis not present

## 2020-07-24 MED ORDER — PHENTERMINE HCL 37.5 MG PO TABS: 37.5000 mg | ORAL_TABLET | Freq: Every day | ORAL | 0 refills | Status: DC

## 2020-07-24 NOTE — Progress Notes (Signed)
Gynecology Annual Exam  PCP: System, Provider Not In  Chief Complaint:  Chief Complaint  Patient presents with  . Gynecologic Exam    Annual - no concerns. RM 3    History of Present Illness: Patient is a 45 y.o. Dana Fischer presents for annual exam. The patient has no complaints today.   LMP: No LMP recorded. (Menstrual status: IUD). IUD no concerns. There is no notable family history of breast or ovarian cancer in her family.  The patient wears seatbelts: yes.   The patient has regular exercise: not asked.    The patient reports current symptoms of depression.     Dana Fischer female, who presents for the evaluation of weight gain. She has gained 16 pounds primarily over 1 year. The patient states the following issues have contributed to her weight problem: lifestyle.  The patient has no additional symptoms. The patient specifically denies memory loss, muscle weakness, excessive thirst, and polyuria. Weight related co-morbidities include none. The patient's past medical history is notable for none. She has tried phentermine interventions in the past with good success.   Review of Systems: Review of Systems  Constitutional: Negative for chills and fever.  HENT: Negative for congestion.   Respiratory: Negative for cough and shortness of breath.   Cardiovascular: Negative for chest pain and palpitations.  Gastrointestinal: Negative for abdominal pain, constipation, diarrhea, heartburn, nausea and vomiting.  Genitourinary: Negative for dysuria, frequency and urgency.  Skin: Negative for itching and rash.  Neurological: Negative for dizziness and headaches.  Endo/Heme/Allergies: Negative for polydipsia.  Psychiatric/Behavioral: Negative for depression.    Past Medical History:  There are no problems to display for this patient.   Past Surgical History:  Past Surgical History:  Procedure Laterality Date  . BREAST BIOPSY Right 2019   benign bx/clip  . BREAST  SURGERY  03/2018   Right breast biopsy/ Malignant  . COLPOSCOPY  2004   WNL @ [facility]  . Mirena  2008  . MOHS SURGERY  2018   precancerous  . WISDOM TOOTH EXTRACTION      Gynecologic History:  No LMP recorded. (Menstrual status: IUD). Contraception: 07/13/2012 Mirena IUD Last Pap: Results were: 03/07/2018 NIL and HR HPV negative  Last mammogram: 03/19/2020 Results were: BI-RAD II (diagnostic)  Obstetric History: [id number]  Family History:  Family History  Problem Relation Age of Onset  . Kidney disease Mother   . Kidney disease Maternal Grandmother   . Heart disease Maternal Grandfather   . Healthy Father   . Breast cancer Neg Hx     Social History:  Social History   Socioeconomic History  . Marital status: Married    Spouse name: Not on file  . Number of children: Not on file  . Years of education: Not on file  . Highest education level: Not on file  Occupational History  . Not on file  Tobacco Use  . Smoking status: Never Smoker  . Smokeless tobacco: Never Used  Vaping Use  . Vaping Use: Never used  Substance and Sexual Activity  . Alcohol use: Yes    Comment: social  . Drug use: Never  . Sexual activity: Yes    Partners: Male    Birth control/protection: I.U.D.  Other Topics Concern  . Not on file  Social History Narrative  . Not on file   Social Determinants of Health   Financial Resource Strain: Not on file  Food Insecurity: Not on file  Transportation Needs:  Not on file  Physical Activity: Not on file  Stress: Not on file  Social Connections: Not on file  Intimate Partner Violence: Not on file    Allergies:  No Known Allergies  Medications: Prior to Admission medications   Medication Sig Start Date End Date Taking? Authorizing Provider  buPROPion (WELLBUTRIN XL) 300 MG 24 hr tablet TAKE 1 TABLET BY MOUTH EVERY DAY 03/22/17   [provider]  FLUoxetine (PROZAC) 20 MG capsule TAKE 1 CAPSULE BY MOUTH EVERY DAY 02/15/17    [provider]  ipratropium (ATROVENT) 0.06 % nasal spray Place 2 sprays into both nostrils 4 (four) times daily. 07/07/20   Shirlee Latch, PA-C  levonorgestrel (MIRENA) 20 MCG/24HR IUD by Intrauterine route.    [provider]  ORACEA 40 MG capsule Take 40 mg by mouth daily. 11/19/17   [provider]  phentermine (ADIPEX-P) 37.5 MG tablet Take 1 tablet (37.5 mg total) by mouth daily before breakfast. 01/01/19   [name], MD  promethazine-dextromethorphan (PROMETHAZINE-DM) 6.25-15 MG/5ML syrup Take 5 mLs by mouth 4 (four) times daily as needed for cough. 07/07/20   Shirlee Latch, PA-C    Physical Exam Vitals: Blood pressure 125/82, pulse 73, height 5\' 7"  (1.702 m), weight 192 lb (87.1 kg). Body mass index is 30.07 kg/m.   General: NAD HEENT: normocephalic, anicteric Thyroid: no enlargement, no palpable nodules Pulmonary: No increased work of breathing, CTAB Cardiovascular: RRR, distal pulses 2+ Breast: Breast symmetrical, no tenderness, no palpable nodules or masses, no skin or nipple retraction present, no nipple discharge.  No axillary or supraclavicular lymphadenopathy. Abdomen: NABS, soft, non-tender, non-distended.  Umbilicus without lesions.  No hepatomegaly, splenomegaly or masses palpable. No evidence of hernia  Genitourinary:  External: Normal external female genitalia.  Normal urethral meatus, normal Bartholin's and Skene's glands.    Vagina: Normal vaginal mucosa, no evidence of prolapse.    Cervix: Grossly normal in appearance, no bleeding, IUD strings visualized  Uterus: Non-enlarged, mobile, normal contour.  No CMT  Adnexa: ovaries non-enlarged, no adnexal masses  Rectal: deferred  Lymphatic: no evidence of inguinal lymphadenopathy Extremities: no edema, erythema, or tenderness Neurologic: Grossly intact Psychiatric: mood appropriate, affect full  Female chaperone present for pelvic and breast  portions of the physical  exam    Assessment: 45 y.o. 49 routine annual exam  Plan: Problem List Items Addressed This Visit   None   Visit Diagnoses    Encounter for gynecological examination without abnormal finding    -  Primary   Breast screening       Screening for malignant neoplasm of cervix       Relevant Orders   Cytology - PAP   Class 1 obesity without serious comorbidity with body mass index (BMI) of 30.0 to 30.9 in adult, unspecified obesity type       Relevant Medications   phentermine (ADIPEX-P) 37.5 MG tablet   Colon cancer screening       Relevant Orders   Ambulatory referral to Gastroenterology   IUD check up         Annual  1) Mammogram - recommend yearly screening mammogram.  Mammogram Is up to date   2) STI screening  was notoffered and therefore not obtained  3) ASCCP guidelines and rational discussed.  Patient opts for every 3 years screening interval  4) Contraception - the patient is currently using  IUD.  She is interested in changing to new Mirena as placement was 2014.  5)  Colonoscopy -- Screening recommended starting at age 72 for average risk individuals, age 49 for individuals deemed at increased risk (including African Americans) and recommended to continue until age 26.  For patient age 91-85 individualized approach is recommended.  Gold standard screening is via colonoscopy, Cologuard screening is an acceptable alternative for patient unwilling or unable to undergo colonoscopy.  "Colorectal cancer screening for average?risk adults: 2018 guideline update from the American Cancer Society"CA: A Cancer Journal for Clinicians: Aug 11, 2016   6) Routine healthcare maintenance including cholesterol, diabetes screening discussed managed by PCP  7) Return in about 4 weeks (around 08/21/2020) for Medication follow up, Mirena IUD removal and replacement.  Obesity  1) 1500 Calorie ADA Diet  2) Patient education given regarding appropriate lifestyle changes for weight loss  including: regular physical activity, healthy coping strategies, caloric restriction and healthy eating patterns.  3) Patient will be started on weight loss medication. The risks and benefits and side effects of medication, such as Adipex (Phenteramine) ,  Tenuate (Diethylproprion), Contrave (buproprion/naltrexone), Qsymia (phentermine/topiramate), and Saxenda (liraglutide) is discussed. The pros and cons of suppressing appetite and boosting metabolism is discussed. Risks of tolerence and addiction is discussed for selected agents discussed. Use of medicine will ne short term, such as 3-4 months at a time followed by a period of time off of the medicine to avoid these risks and side effects for Adipex, Qsymia, and Tenuate discussed. Pt to call with any negative side effects and agrees to keep follow up appts. - rx phentermine  4) ) Encouraged weekly weight monitorig to track progress and sample 1 week food diary  5) 15 minutes face-to-face; counseling/coordination of care > 50 percent of visit  6) Follow up in 4 weeks to assess response     [name], MD, [facility], [facility] 07/24/2020, 3:38 PM

## 2020-07-30 LAB — CYTOLOGY - PAP
Adequacy: ABSENT
Comment: NEGATIVE
Diagnosis: NEGATIVE
High risk HPV: NEGATIVE

## 2020-08-22 ENCOUNTER — Encounter: Payer: Self-pay | Admitting: Obstetrics and Gynecology

## 2020-08-22 ENCOUNTER — Ambulatory Visit (INDEPENDENT_AMBULATORY_CARE_PROVIDER_SITE_OTHER): Payer: 59 | Admitting: Obstetrics and Gynecology

## 2020-08-22 ENCOUNTER — Other Ambulatory Visit: Payer: Self-pay

## 2020-08-22 VITALS — BP 118/78 | Ht 67.0 in | Wt 188.0 lb

## 2020-08-22 DIAGNOSIS — Z30430 Encounter for insertion of intrauterine contraceptive device: Secondary | ICD-10-CM | POA: Diagnosis not present

## 2020-08-22 DIAGNOSIS — Z30432 Encounter for removal of intrauterine contraceptive device: Secondary | ICD-10-CM | POA: Diagnosis not present

## 2020-08-22 DIAGNOSIS — Z6829 Body mass index (BMI) 29.0-29.9, adult: Secondary | ICD-10-CM

## 2020-08-22 DIAGNOSIS — E663 Overweight: Secondary | ICD-10-CM | POA: Diagnosis not present

## 2020-08-22 MED ORDER — PHENTERMINE HCL 37.5 MG PO TABS: 37.5000 mg | ORAL_TABLET | Freq: Every day | ORAL | 0 refills | Status: DC

## 2020-08-22 NOTE — Progress Notes (Signed)
Gynecology Office Visit  Chief Complaint:  Chief Complaint  Patient presents with   Follow-up    Weight loss, IUD removal/replacement. RM 5    History of Present Illness: Patientis a 45 y.o. [name] female, who presents for the evaluation of the desire to lose weight. She has lost 4 pounds 1 months. The patient states the following symptoms since starting her weight loss therapy: appetite suppression, energy, and weight loss.  The patient also reports no other ill effects. The patient specifically denies heart palpitations, anxiety, and insomnia.    Review of Systems: 10 point review of systems negative unless otherwise noted in HPI  Past Medical History:  Past Medical History:  Diagnosis Date   Abnormal Pap smear of cervix    Anxiety    Depression     Past Surgical History:  Past Surgical History:  Procedure Laterality Date   BREAST BIOPSY Right 2019   benign bx/clip   BREAST SURGERY  03/2018   Right breast biopsy/ Malignant   COLPOSCOPY  2004   WNL @ [facility]   Mirena  2008   MOHS SURGERY  2018   precancerous   WISDOM TOOTH EXTRACTION      Gynecologic History: No LMP recorded. (Menstrual status: IUD).  Obstetric History: [id number]  Family History:  Family History  Problem Relation Age of Onset   Kidney disease Mother    Kidney disease Maternal Grandmother    Heart disease Maternal Grandfather    Healthy Father    Breast cancer Neg Hx     Social History:  Social History   Socioeconomic History   Marital status: Married    Spouse name: Not on file   Number of children: Not on file   Years of education: Not on file   Highest education level: Not on file  Occupational History   Not on file  Tobacco Use   Smoking status: Never   Smokeless tobacco: Never  Vaping Use   Vaping Use: Never used  Substance and Sexual Activity   Alcohol use: Yes    Comment: social   Drug use: Never   Sexual activity: Yes    Partners: Male    Birth  control/protection: I.U.D.  Other Topics Concern   Not on file  Social History Narrative   Not on file   Social Determinants of Health   Financial Resource Strain: Not on file  Food Insecurity: Not on file  Transportation Needs: Not on file  Physical Activity: Not on file  Stress: Not on file  Social Connections: Not on file  Intimate Partner Violence: Not on file    Allergies:  No Known Allergies  Medications: Prior to Admission medications   Medication Sig Start Date End Date Taking? Authorizing Provider  buPROPion (WELLBUTRIN XL) 300 MG 24 hr tablet TAKE 1 TABLET BY MOUTH EVERY DAY 03/22/17  Yes [provider]  phentermine (ADIPEX-P) 37.5 MG tablet Take 1 tablet (37.5 mg total) by mouth daily before breakfast. 07/24/20  Yes [name], MD  levonorgestrel (MIRENA) 20 MCG/24HR IUD by Intrauterine route.    [provider]    Physical Exam Blood pressure 118/78, height 5\' 7"  (1.702 m), weight 188 lb (85.3 kg). Wt Readings from Last 3 Encounters:  08/22/20 188 lb (85.3 kg)  07/24/20 192 lb (87.1 kg)  01/01/19 168 lb (76.2 kg)  Body mass index is 29.44 kg/m.   General: NAD HEENT: normocephalic, anicteric Thyroid: no enlargement Pulmonary: no increased work of breathing  Neurologic: Grossly intact Psychiatric: mood appropriate, affect full   GYNECOLOGY OFFICE PROCEDURE NOTE  Dana Fischer is a 45 y.o. [id number] here for IUD removal and reinsertion. The patient currently has a Mirena IUD placed on 8 years ago, which will be replaced with a Mirena IUD today.  No GYN concerns.  Last pap smear was on 07/24/2020 and was normal.  IUD Removal and Reinsertion  Patient identified, informed consent performed, consent signed.   Discussed risks of irregular bleeding, cramping, infection, malpositioning or uterine perforation of the IUD which may require further procedures. Time out was performed. Speculum placed in the vagina. The strings of the IUD were grasped  and pulled using ring forceps. The IUD was successfully removed in its entirety. The cervix was cleaned with Betadine x 2 and grasped anteriorly with a single tooth tenaculum.  The uterus was sounded to IUD insertion apparatus was used to sound the uterus to 7.5 cm using a uterine sound.  The IUD was then placed per manufacturer's recommendations. Strings trimmed to 3 cm. Tenaculum was removed, good hemostasis noted. Patient tolerated procedure well.   Patient was given post-procedure instructions.  Patient was also asked to check IUD strings periodically and follow up in 6 weeks for IUD check.  Assessment: 45 y.o. [id number] follow obesity  Plan: Problem List Items Addressed This Visit   None Visit Diagnoses     Overweight (BMI 25.0-29.9)    -  Primary   BMI 29.0-29.9,adult       Encounter for IUD removal       Encounter for IUD insertion            Obesity  1) 1500 Calorie ADA Diet  2) Patient education given regarding appropriate lifestyle changes for weight loss including: regular physical activity, healthy coping strategies, caloric restriction and healthy eating patterns.  3) Patient to take medication, with the benefits of appetite suppression and metabolism boost d/w pt, along with the side effects and risk factors of long term use that will be avoided with our use of short bursts of therapy. Rx provided.    4) 15 minutes face-to-face; with counseling/coordination of care > 50 percent of visit related to obesity and ongoing management/treatment   IUD  1) IUD removed and replaced today without difficulty.  String check 4 weeks      [name], MD, [facility], [facility] 08/22/2020, 9:06 AM

## 2020-09-17 ENCOUNTER — Telehealth (INDEPENDENT_AMBULATORY_CARE_PROVIDER_SITE_OTHER): Payer: 59 | Admitting: Gastroenterology

## 2020-09-17 DIAGNOSIS — Z1211 Encounter for screening for malignant neoplasm of colon: Secondary | ICD-10-CM

## 2020-09-17 MED ORDER — GOLYTELY 236 G PO SOLR: 4000.0000 mL | Freq: Once | ORAL | 0 refills | Status: AC

## 2020-09-17 NOTE — Progress Notes (Signed)
Gastroenterology Pre-Procedure Review  Request Date: 09/29/20 Requesting Physician: Dr. Tobi Bastos  PATIENT REVIEW QUESTIONS: The patient responded to the following health history questions as indicated:    1. Are you having any GI issues? no 2. Do you have a personal history of Polyps? no 3. Do you have a family history of Colon Cancer or Polyps? no 4. Diabetes Mellitus? no 5. Joint replacements in the past 12 months?no 6. Major health problems in the past 3 months?no 7. Any artificial heart valves, MVP, or defibrillator?no    MEDICATIONS & ALLERGIES:    Patient reports the following regarding taking any anticoagulation/antiplatelet therapy:   Plavix, Coumadin, Eliquis, Xarelto, Lovenox, Pradaxa, Brilinta, or Effient? no Aspirin? no  Patient confirms/reports the following medications:  Current Outpatient Medications  Medication Sig Dispense Refill   buPROPion (WELLBUTRIN XL) 300 MG 24 hr tablet TAKE 1 TABLET BY MOUTH EVERY DAY     levonorgestrel (MIRENA) 20 MCG/24HR IUD by Intrauterine route.     phentermine (ADIPEX-P) 37.5 MG tablet Take 1 tablet (37.5 mg total) by mouth daily before breakfast. 30 tablet 0   No current facility-administered medications for this visit.    Patient confirms/reports the following allergies:  No Known Allergies  No orders of the defined types were placed in this encounter.   AUTHORIZATION INFORMATION Primary Insurance: 1D#: Group #:  Secondary Insurance: 1D#: Group #:  SCHEDULE INFORMATION: Date: 10/10/20 Time: Location: [facility]

## 2020-09-24 ENCOUNTER — Ambulatory Visit: Payer: 59 | Admitting: Obstetrics and Gynecology

## 2020-10-09 ENCOUNTER — Ambulatory Visit: Payer: 59 | Admitting: Obstetrics and Gynecology

## 2020-10-09 ENCOUNTER — Telehealth: Payer: Self-pay | Admitting: Gastroenterology

## 2020-10-09 NOTE — Telephone Encounter (Signed)
Patient's husband has been diagnosed with Covid and she will need to r/s her procedure as it is tomorrow 10/10/20

## 2020-10-09 NOTE — Telephone Encounter (Signed)
Procedure has been cancelled per Endo unit. Will message patient regarding rescheduling.

## 2020-10-10 ENCOUNTER — Other Ambulatory Visit: Payer: Self-pay | Admitting: Obstetrics and Gynecology

## 2020-10-10 MED ORDER — PHENTERMINE HCL 37.5 MG PO TABS: 37.5000 mg | ORAL_TABLET | Freq: Every day | ORAL | 0 refills | Status: DC

## 2020-10-13 ENCOUNTER — Other Ambulatory Visit: Payer: Self-pay

## 2020-10-13 NOTE — Progress Notes (Signed)
Updated instructions sent via my chart

## 2020-10-28 ENCOUNTER — Other Ambulatory Visit: Payer: Self-pay

## 2020-10-28 MED ORDER — NA SULFATE-K SULFATE-MG SULF 17.5-3.13-1.6 GM/177ML PO SOLN: 1.0000 | Freq: Once | ORAL | 0 refills | Status: AC

## 2020-10-28 NOTE — Progress Notes (Signed)
Patient requested a different prep. New prep sent to pharmacy.

## 2020-10-29 ENCOUNTER — Ambulatory Visit (INDEPENDENT_AMBULATORY_CARE_PROVIDER_SITE_OTHER): Payer: 59 | Admitting: Obstetrics and Gynecology

## 2020-10-29 ENCOUNTER — Encounter: Payer: Self-pay | Admitting: Obstetrics and Gynecology

## 2020-10-29 ENCOUNTER — Other Ambulatory Visit: Payer: Self-pay

## 2020-10-29 VITALS — BP 107/71 | Ht 68.0 in | Wt 188.0 lb

## 2020-10-29 DIAGNOSIS — Z30431 Encounter for routine checking of intrauterine contraceptive device: Secondary | ICD-10-CM

## 2020-10-29 DIAGNOSIS — E663 Overweight: Secondary | ICD-10-CM | POA: Diagnosis not present

## 2020-10-29 DIAGNOSIS — Z6828 Body mass index (BMI) 28.0-28.9, adult: Secondary | ICD-10-CM | POA: Diagnosis not present

## 2020-10-29 NOTE — Progress Notes (Signed)
Obstetrics & Gynecology Office Visit   Chief Complaint:  Chief Complaint  Fischer presents with   Follow-up    IUD string check - no concerns. Procedure RM    History of Present Illness: 45 y.o. Fischer presenting for follow up of Mirena IUD placement 6+ weeks ago.  The indication for her IUD was cycle control.  She denies any complications since her IUD placement.  Still having some occasional spotting.  Has not checked strings.  Finished out phentermine starting 3 month drug free holiday.  Colonoscopy scheduled for tomorrow  Review of Systems: Review of Systems  Constitutional: Negative.   Gastrointestinal: Negative.   Genitourinary: Negative.    Past Medical History:  Past Medical History:  Diagnosis Date   Abnormal Pap smear of cervix    Anxiety    Depression     Past Surgical History:  Past Surgical History:  Procedure Laterality Date   BREAST BIOPSY Right 2019   benign bx/clip   BREAST SURGERY  03/2018   Right breast biopsy/ Malignant   COLPOSCOPY  2004   WNL @ [facility]   Mirena  2008   MOHS SURGERY  2018   precancerous   WISDOM TOOTH EXTRACTION      Gynecologic History: No LMP recorded. (Menstrual status: IUD).  Obstetric History: [id number]  Family History:  Family History  Problem Relation Age of Onset   Kidney disease Mother    Kidney disease Maternal Grandmother    Heart disease Maternal Grandfather    Healthy Father    Breast cancer Neg Hx     Social History:  Social History   Socioeconomic History   Marital status: Married    Spouse name: Not on file   Number of children: Not on file   Years of education: Not on file   Highest education level: Not on file  Occupational History   Not on file  Tobacco Use   Smoking status: Never   Smokeless tobacco: Never  Vaping Use   Vaping Use: Never used  Substance and Sexual Activity   Alcohol use: Yes    Comment: social   Drug use: Never   Sexual activity: Yes    Partners:  Male    Birth control/protection: I.U.D.  Other Topics Concern   Not on file  Social History Narrative   Not on file   Social Determinants of Health   Financial Resource Strain: Not on file  Food Insecurity: Not on file  Transportation Needs: Not on file  Physical Activity: Not on file  Stress: Not on file  Social Connections: Not on file  Intimate Partner Violence: Not on file    Allergies:  No Known Allergies  Medications: Prior to Admission medications   Medication Sig Start Date End Date Taking? Authorizing Provider  buPROPion (WELLBUTRIN XL) 300 MG 24 hr tablet TAKE 1 TABLET BY MOUTH EVERY DAY 03/22/17   [provider]  levonorgestrel (MIRENA) 20 MCG/24HR IUD by Intrauterine route.    [provider]  phentermine (ADIPEX-P) 37.5 MG tablet Take 1 tablet (37.5 mg total) by mouth daily before breakfast. 10/10/20   [name], MD    Physical Exam Blood pressure 107/71, height 5\' 8"  (1.727 m), weight 188 lb (85.3 kg). No LMP recorded. (Menstrual status: IUD). Body mass index is 28.59 kg/m.  General: NAD HEENT: normocephalic, anicteric Pulmonary: No increased work of breathing  Genitourinary:  External: Normal external female genitalia.  Normal urethral meatus, normal  Bartholin's and Skene's  glands.    Vagina: Normal vaginal mucosa, no evidence of prolapse.    Cervix: Grossly normal in appearance, no bleeding, IUD strings visualized 2cm  Uterus: Non-enlarged, mobile, normal contour.  No CMT  Adnexa: ovaries non-enlarged, no adnexal masses  Rectal: deferred  Lymphatic: no evidence of inguinal lymphadenopathy Extremities: no edema, erythema, or tenderness Neurologic: Grossly intact Psychiatric: mood appropriate, affect full  Female chaperone present for pelvic and breast  portions of the physical exam  Assessment: 45 y.o. [id number] No problem-specific Assessment & Plan notes found for this encounter.   Plan: Problem List Items Addressed This  Visit   None Visit Diagnoses     IUD check up    -  Primary   Overweight (BMI 25.0-29.9)       BMI 28.0-28.9,adult            1.  The Fischer was given instructions to check her IUD strings monthly and call with any problems or concerns.  She should call for fevers, chills, abnormal vaginal discharge, pelvic pain, or other complaints.  Dana.   IUDs while effective at preventing pregnancy do not prevent transmission of sexually transmitted diseases and use of barrier methods for this purpose was discussed.  Low overall incidence of failure with 99.7% efficacy rate in typical use.  The Fischer has not contraindication to IUD placement.  3.  She will return for a annual exam in 1 year.  All questions answered.  4) A total of 15 minutes were spent in face-to-face contact with the Fischer during this encounter with over half of that time devoted to counseling and coordination of care.  5) Weight loss - Body mass index is 28.59 kg/m. Will folow up as needed if weight trends up or plateaus   6) Return in about 1 year (around 10/29/2021) for annual.   [name], MD, [facility], [facility] 10/29/2020, 9:29 AM

## 2020-10-30 ENCOUNTER — Ambulatory Visit: Payer: 59 | Admitting: Registered Nurse

## 2020-10-30 ENCOUNTER — Ambulatory Visit
Admission: RE | Admit: 2020-10-30 | Discharge: 2020-10-30 | Disposition: A | Discharge status: Home / Self Care | Payer: 59 | Attending: Gastroenterology | Admitting: Gastroenterology

## 2020-10-30 ENCOUNTER — Encounter
Admission: RE | Disposition: A | Discharge status: Home / Self Care | Payer: Self-pay | Source: Home / Self Care | Attending: Gastroenterology

## 2020-10-30 DIAGNOSIS — Z79899 Other long term (current) drug therapy: Secondary | ICD-10-CM | POA: Diagnosis not present

## 2020-10-30 DIAGNOSIS — Z793 Long term (current) use of hormonal contraceptives: Secondary | ICD-10-CM | POA: Insufficient documentation

## 2020-10-30 DIAGNOSIS — Z1211 Encounter for screening for malignant neoplasm of colon: Secondary | ICD-10-CM

## 2020-10-30 HISTORY — PX: COLONOSCOPY WITH PROPOFOL: SHX5780

## 2020-10-30 LAB — POCT PREGNANCY, URINE: Preg Test, Ur: NEGATIVE

## 2020-10-30 SURGERY — COLONOSCOPY WITH PROPOFOL
Anesthesia: General

## 2020-10-30 MED ORDER — LIDOCAINE HCL (CARDIAC) PF 100 MG/5ML IV SOSY
PREFILLED_SYRINGE | INTRAVENOUS | Status: DC | PRN
  Administered 2020-10-30: 40 mg via INTRAVENOUS

## 2020-10-30 MED ORDER — SODIUM CHLORIDE 0.9 % IV SOLN
INTRAVENOUS | Status: DC
  Administered 2020-10-30: 1000 mL via INTRAVENOUS

## 2020-10-30 MED ORDER — PROPOFOL 500 MG/50ML IV EMUL
INTRAVENOUS | Status: AC
  Filled 2020-10-30: qty 50

## 2020-10-30 MED ORDER — PROPOFOL 10 MG/ML IV BOLUS
INTRAVENOUS | Status: DC | PRN
  Administered 2020-10-30: 70 mg via INTRAVENOUS

## 2020-10-30 MED ORDER — DEXMEDETOMIDINE (PRECEDEX) IN NS 20 MCG/5ML (4 MCG/ML) IV SYRINGE
PREFILLED_SYRINGE | INTRAVENOUS | Status: DC | PRN
  Administered 2020-10-30: 8 ug via INTRAVENOUS

## 2020-10-30 MED ORDER — PROPOFOL 500 MG/50ML IV EMUL
INTRAVENOUS | Status: DC | PRN
  Administered 2020-10-30: 150 ug/kg/min via INTRAVENOUS

## 2020-10-30 NOTE — Transfer of Care (Signed)
Immediate Anesthesia Transfer of Care Note  Patient: Dana Fischer  Procedure(s) Performed: Procedure(s): COLONOSCOPY WITH PROPOFOL (N/A)  Patient Location: PACU and Endoscopy Unit  Anesthesia Type:General  Level of Consciousness: sedated  Airway & Oxygen Therapy: Patient Spontanous Breathing and Patient connected to nasal cannula oxygen  Post-op Assessment: Report given to RN and Post -op Vital signs reviewed and stable  Post vital signs: Reviewed and stable  Last Vitals:  Vitals:   10/30/20 0936 10/30/20 1023  BP: 114/67 103/62  Pulse: 72 83  Resp: 16 (!) 21  Temp: (!) 35.9 C (!) 35.8 C  SpO2: 98% 97%    Complications: No apparent anesthesia complications

## 2020-10-30 NOTE — Anesthesia Postprocedure Evaluation (Signed)
Anesthesia Post Note  Patient: Dana Fischer  Procedure(s) Performed: COLONOSCOPY WITH PROPOFOL  Patient location during evaluation: Phase II Anesthesia Type: General Level of consciousness: awake and alert, awake and oriented Pain management: pain level controlled Vital Signs Assessment: post-procedure vital signs reviewed and stable Respiratory status: spontaneous breathing, nonlabored ventilation and respiratory function stable Cardiovascular status: blood pressure returned to baseline and stable Postop Assessment: no apparent nausea or vomiting Anesthetic complications: no   No notable events documented.   Last Vitals:  Vitals:   10/30/20 1033 10/30/20 1043  BP: 106/65 100/63  Pulse: 75 75  Resp: 18 20  Temp:    SpO2: 100% 100%    Last Pain:  Vitals:   10/30/20 1043  TempSrc:   PainSc: 0-No pain                 Manfred Arch

## 2020-10-30 NOTE — Anesthesia Preprocedure Evaluation (Signed)
Anesthesia Evaluation  Patient identified by MRN, date of birth, ID band Patient awake    Reviewed: Allergy & Precautions, NPO status , Patient's Chart, lab work & pertinent test results  Airway Mallampati: II  TM Distance: >3 FB Neck ROM: Full    Dental no notable dental hx.    Pulmonary neg pulmonary ROS,    Pulmonary exam normal        Cardiovascular negative cardio ROS Normal cardiovascular exam     Neuro/Psych PSYCHIATRIC DISORDERS Anxiety Depression negative neurological ROS     GI/Hepatic negative GI ROS, Neg liver ROS,   Endo/Other  negative endocrine ROS  Renal/GU negative Renal ROS  negative genitourinary   Musculoskeletal negative musculoskeletal ROS (+)   Abdominal   Peds negative pediatric ROS (+)  Hematology negative hematology ROS (+)   Anesthesia Other Findings   Reproductive/Obstetrics negative OB ROS                             Anesthesia Physical Anesthesia Plan  ASA: 2  Anesthesia Plan: General   Post-op Pain Management:    Induction: Intravenous  PONV Risk Score and Plan: 2 and Propofol infusion and TIVA  Airway Management Planned: Natural Airway and Nasal Cannula  Additional Equipment:   Intra-op Plan:   Post-operative Plan:   Informed Consent: I have reviewed the patients History and Physical, chart, labs and discussed the procedure including the risks, benefits and alternatives for the proposed anesthesia with the patient or authorized representative who has indicated his/her understanding and acceptance.       Plan Discussed with: CRNA, Anesthesiologist and Surgeon  Anesthesia Plan Comments:         Anesthesia Quick Evaluation

## 2020-10-30 NOTE — Anesthesia Procedure Notes (Signed)
Date/Time: 10/30/2020 9:57 AM Performed by: Stormy Fabian, CRNA Pre-anesthesia Checklist: Patient identified, Emergency Drugs available, Suction available and Patient being monitored Patient Re-evaluated:Patient Re-evaluated prior to induction Oxygen Delivery Method: Nasal cannula Induction Type: IV induction Dental Injury: Teeth and Oropharynx as per pre-operative assessment  Comments: Nasal cannula with etCO2 monitoring

## 2020-10-30 NOTE — Op Note (Signed)
[facility] Gastroenterology Patient Name: Dana Fischer Procedure Date: 10/30/2020 9:46 AM MRN: [id number] Account #: [id number] Date of Birth: 1975/08/14 Admit Type: Outpatient Age: 45 Room: [facility] ENDO ROOM 3 Gender: Female Note Status: Finalized Procedure:             Colonoscopy Indications:           Screening for colorectal malignant neoplasm Providers:             [name] MD, MD Referring MD:          No Local Md, MD (Referring MD) Medicines:             Monitored Anesthesia Care Complications:         No immediate complications. Procedure:             Pre-Anesthesia Assessment:                        - Prior to the procedure, a History and Physical was                         performed, and patient medications, allergies and                         sensitivities were reviewed. The patient's tolerance                         of previous anesthesia was reviewed.                        - The risks and benefits of the procedure and the                         sedation options and risks were discussed with the                         patient. All questions were answered and informed                         consent was obtained.                        - ASA Grade Assessment: II - A patient with mild                         systemic disease.                        After obtaining informed consent, the colonoscope was                         passed under direct vision. Throughout the procedure,                         the patient's blood pressure, pulse, and oxygen                         saturations were monitored continuously. The                         Colonoscope was introduced through the anus  and                         advanced to the the cecum, identified by the                         appendiceal orifice. The colonoscopy was performed                         with ease. The patient tolerated the procedure well.                         The quality of the  bowel preparation was excellent. Findings:      The perianal and digital rectal examinations were normal.      The entire examined colon appeared normal on direct and retroflexion       views. Impression:            - The entire examined colon is normal on direct and                         retroflexion views.                        - No specimens collected. Recommendation:        - Discharge patient to home (with escort).                        - Resume previous diet.                        - Continue present medications.                        - Repeat colonoscopy in 10 years for screening                         purposes. Procedure Code(s):     --- Professional ---                        [phone], Colonoscopy, flexible; diagnostic, including                         collection of specimen(s) by brushing or washing, when                         performed (separate procedure) Diagnosis Code(s):     --- Professional ---                        Z12.11, Encounter for screening for malignant neoplasm                         of colon CPT copyright 2019 American Medical Association. All rights reserved. The codes documented in this report are preliminary and upon coder review may  be revised to meet current compliance requirements. [name], MD [name] MD, MD 10/30/2020 10:22:10 AM This report has been signed electronically. Number of Addenda: 0 Note Initiated On: 10/30/2020 9:46 AM Scope Withdrawal Time: 0 hours 11 minutes 44 seconds  Total Procedure Duration: 0 hours 17 minutes 57 seconds  Estimated Blood Loss:  Estimated blood loss: none.      [facility]

## 2020-10-31 ENCOUNTER — Encounter: Payer: Self-pay | Admitting: Gastroenterology

## 2020-11-02 NOTE — H&P (Signed)
[name], MD [address], [address], [place], [place], [address] [address], [address], [place], [place], [address] Phone: [phone]  Fax: [phone]  Primary Care Physician:  Perfecto Kingdom, MD   Pre-Procedure History & Physical: HPI:  Dana Fischer is a 45 y.o. female is here for an colonoscopy.   Past Medical History:  Diagnosis Date   Abnormal Pap smear of cervix    Anxiety    Depression     Past Surgical History:  Procedure Laterality Date   BREAST BIOPSY Right 2019   benign bx/clip   BREAST SURGERY  03/2018   Right breast biopsy/ Malignant   COLONOSCOPY WITH PROPOFOL N/A 10/30/2020   Procedure: COLONOSCOPY WITH PROPOFOL;  Surgeon: [name], MD;  Location: [facility] ENDOSCOPY;  Service: Gastroenterology;  Laterality: N/A;   COLPOSCOPY  2004   WNL @ [facility]   Mirena  2008   MOHS SURGERY  2018   precancerous   WISDOM TOOTH EXTRACTION      Prior to Admission medications   Medication Sig Start Date End Date Taking? Authorizing Provider  buPROPion (WELLBUTRIN XL) 300 MG 24 hr tablet TAKE 1 TABLET BY MOUTH EVERY DAY 03/22/17  Yes [provider]  levonorgestrel (MIRENA) 20 MCG/24HR IUD by Intrauterine route.   Yes [provider]  phentermine (ADIPEX-P) 37.5 MG tablet Take 1 tablet (37.5 mg total) by mouth daily before breakfast. 10/10/20  Yes [name], MD    Allergies as of 09/17/2020   (No Known Allergies)    Family History  Problem Relation Age of Onset   Kidney disease Mother    Kidney disease Maternal Grandmother    Heart disease Maternal Grandfather    Healthy Father    Breast cancer Neg Hx     Social History   Socioeconomic History   Marital status: Married    Spouse name: Not on file   Number of children: Not on file   Years of education: Not on file   Highest education level: Not on file  Occupational History   Not on file  Tobacco Use   Smoking status: Never   Smokeless tobacco: Never   Vaping Use   Vaping Use: Never used  Substance and Sexual Activity   Alcohol use: Yes    Comment: social   Drug use: Never   Sexual activity: Yes    Partners: Male    Birth control/protection: I.U.D.  Other Topics Concern   Not on file  Social History Narrative   Not on file   Social Determinants of Health   Financial Resource Strain: Not on file  Food Insecurity: Not on file  Transportation Needs: Not on file  Physical Activity: Not on file  Stress: Not on file  Social Connections: Not on file  Intimate Partner Violence: Not on file    Review of Systems: See HPI, otherwise negative ROS  Physical Exam: BP 101/71   Pulse 74   Temp (!) 96.4 F (35.8 C) (Tympanic)   Resp 17   Ht 5\' 8"  (1.727 m)   Wt 85.5 kg   SpO2 100%   BMI 28.64 kg/m  General:   Alert,  pleasant and cooperative in NAD Head:  Normocephalic and atraumatic. Neck:  Supple; no masses or thyromegaly. Lungs:  Clear throughout to auscultation, normal respiratory effort.    Heart:  +S1, +S2, Regular rate and rhythm, No edema. Abdomen:  Soft, nontender and nondistended. Normal bowel sounds, without guarding, and without rebound.  Neurologic:  Alert and  oriented x4;  grossly normal neurologically.  Impression/Plan: Dana Fischer is here for an colonoscopy to be performed for Screening colonoscopy average risk   Risks, benefits, limitations, and alternatives regarding  colonoscopy have been reviewed with the patient.  Questions have been answered.  All parties agreeable.   [name], MD  11/02/2020, 11:37 AM

## 2021-06-22 ENCOUNTER — Telehealth: Payer: Self-pay

## 2021-06-22 DIAGNOSIS — Z1231 Encounter for screening mammogram for malignant neoplasm of breast: Secondary | ICD-10-CM

## 2021-06-22 NOTE — Telephone Encounter (Signed)
Pt is really due for annual with orders placed then. Does she plan to schedule annual? ?

## 2021-06-22 NOTE — Telephone Encounter (Signed)
Mammo order placed.

## 2021-06-22 NOTE — Telephone Encounter (Signed)
Can you put in mammo orders for pt? ?

## 2021-06-22 NOTE — Telephone Encounter (Signed)
Called pt no answer will try again

## 2021-06-22 NOTE — Telephone Encounter (Signed)
Pt state she will be coming in for a annual this year, she usually does her mammo in December and the last one was 2021 she missed this past December.

## 2021-08-04 ENCOUNTER — Ambulatory Visit: Payer: 59

## 2021-09-07 ENCOUNTER — Ambulatory Visit
Admission: RE | Admit: 2021-09-07 | Discharge: 2021-09-07 | Disposition: A | Discharge status: Home / Self Care | Payer: 59 | Source: Ambulatory Visit | Attending: Obstetrics and Gynecology | Admitting: Obstetrics and Gynecology

## 2021-09-07 DIAGNOSIS — Z1231 Encounter for screening mammogram for malignant neoplasm of breast: Secondary | ICD-10-CM

## 2022-12-01 ENCOUNTER — Telehealth: Payer: Self-pay

## 2022-12-01 NOTE — Telephone Encounter (Signed)
Pt called for a mammo order to be placed. I advised her she needed an annual and transferred her to [facility] to schedule.

## 2022-12-09 ENCOUNTER — Ambulatory Visit (INDEPENDENT_AMBULATORY_CARE_PROVIDER_SITE_OTHER): Payer: 59

## 2022-12-09 VITALS — BP 125/77 | HR 62 | Ht 68.0 in | Wt 172.3 lb

## 2022-12-09 DIAGNOSIS — Z1231 Encounter for screening mammogram for malignant neoplasm of breast: Secondary | ICD-10-CM

## 2022-12-09 DIAGNOSIS — Z23 Encounter for immunization: Secondary | ICD-10-CM

## 2022-12-09 DIAGNOSIS — Z01419 Encounter for gynecological examination (general) (routine) without abnormal findings: Secondary | ICD-10-CM | POA: Diagnosis not present

## 2022-12-09 DIAGNOSIS — E0789 Other specified disorders of thyroid: Secondary | ICD-10-CM | POA: Insufficient documentation

## 2022-12-09 DIAGNOSIS — Z79890 Hormone replacement therapy (postmenopausal): Secondary | ICD-10-CM

## 2022-12-09 DIAGNOSIS — E079 Disorder of thyroid, unspecified: Secondary | ICD-10-CM

## 2022-12-09 MED ORDER — ESTRADIOL 0.05 MG/24HR TD PTWK
0.0500 mg | MEDICATED_PATCH | TRANSDERMAL | 3 refills | Status: DC
Start: 2022-12-09 — End: 2023-11-16

## 2022-12-09 NOTE — Assessment & Plan Note (Signed)
-   Reviewed health maintenance topics as documented below. - Most recent pap smear in 2022, NILM/HPV neg, repeat cervical cancer screening due 2027 . - Mammogram UTD, ordered screening today.  - Mirena IUD inserted in 2022. Satisfied with current contraception.  - STI screening declined.  - Discussed options for managing symptoms associated with perimenopause/menopause including use of hormones. Given that she has a family history of early menopause (mother at age 34) and currently has a Mirena IUD in place providing endometrial protection, discussed use of estradiol patch. Reviewed benefits and risks associated with HRT. After this discussion, she would like to trial the estradiol patch at the lowest dose. Rx provided. - Following with PCP for other screening and health maintenance labs. - Tdap administered today.

## 2022-12-09 NOTE — Assessment & Plan Note (Signed)
-   Possible mass noted on left side of thyroid, approximately 3 cm in diameter. Thyroid hormone panel and thyroid ultrasound ordered today. Will follow up based on results.

## 2022-12-09 NOTE — Progress Notes (Signed)
Outpatient Gynecology Note: Annual Visit  Assessment/Plan:    Dana Fischer is a 47 y.o. female [phone] with normal well-woman gynecologic exam.   Well woman exam - Reviewed health maintenance topics as documented below. - Most recent pap smear in 2022, NILM/HPV neg, repeat cervical cancer screening due 2027 . - Mammogram UTD, ordered screening today.  - Mirena IUD inserted in 2022. Satisfied with current contraception.  - STI screening declined.  - Discussed options for managing symptoms associated with perimenopause/menopause including use of hormones. Given that she has a family history of early menopause (mother at age 64) and currently has a Mirena IUD in place providing endometrial protection, discussed use of estradiol patch. Reviewed benefits and risks associated with HRT. After this discussion, she would like to trial the estradiol patch at the lowest dose. Rx provided. - Following with PCP for other screening and health maintenance labs. - Tdap administered today.  Thyroid mass of unclear etiology - Possible mass noted on left side of thyroid, approximately 3 cm in diameter. Thyroid hormone panel and thyroid ultrasound ordered today. Will follow up based on results.      Orders Placed This Encounter  Procedures   MM 3D SCREENING MAMMOGRAM BILATERAL BREAST    Standing Status:   Future    Standing Expiration Date:   12/09/2023    Order Specific Question:   Reason for Exam (SYMPTOM  OR DIAGNOSIS REQUIRED)    Answer:   screening    Order Specific Question:   Is the patient pregnant?    Answer:   No    Order Specific Question:   Preferred imaging location?    Answer:   [facility]   US THYROID    Standing Status:   Future    Standing Expiration Date:   12/09/2023    Order Specific Question:   Reason for Exam (SYMPTOM  OR DIAGNOSIS REQUIRED)    Answer:   thyroid nodule    Order Specific Question:   Preferred imaging location?    Answer:   [facility]   Tdap  vaccine greater than or equal to 7yo IM   TSH + Margarethe Virgen T4   Current Outpatient Medications  Medication Instructions   buPROPion (WELLBUTRIN XL) 300 MG 24 hr tablet TAKE 1 TABLET BY MOUTH EVERY DAY   estradiol (CLIMARA - DOSED IN MG/24 HR) 0.05 mg, Transdermal, Weekly   levonorgestrel (MIRENA) 20 MCG/24HR IUD Intrauterine   tretinoin (RETIN-A) 0.05 % cream SMARTSIG:1 Sparingly Topical Every Night    No follow-ups on file.    Subjective:    Dana Fischer is a 47 y.o. female [id number] who presents for annual wellness visit.   CONCERNS? Desires insight into phase of life.Current symptoms include low energy, low libido, brain fog, some sleep disturbance, weight gain, and irritability. Has had IUD for many years.   Well Woman Visit:  GYN HISTORY:  No LMP recorded. (Menstrual status: IUD).     Menstrual History: OB History     Gravida  3   Para  2   Term  2   Preterm      AB  1   Living  2      SAB  1   IAB      Ectopic      Multiple      Live Births  2           Menarche age: 66 No LMP recorded. (Menstrual status: IUD).    Intermenstrual bleeding,  spotting, or discharge? none Urinary incontinence? none  Sexually active: yes Number of sexual partners: one Gender of sexual Partners: male Dyspareunia? no Last YQM:VHQIONGEXBM date 2022 and was normal  History of abnormal Pap: yes, colposcopy was normal, prior to children STI history: no STI/HIV testing or immunizations needed? No.  Contraceptive methods: IUD  Health Maintenance > Reviewed breast self-awareness, no history of breast cancer > History of abnormal mammogram: Yes, benign findings > Exercise: aerobics and walking, very active > Dietary Supplements: no > Body mass index is 26.2 kg/m.  > Recent dental visit Yes.   > Seat Belt Use: Yes.   > Texting and driving? No. > Guns in the house No. > Concern for alcohol abuse? 4-5 drinks/week   Tobacco or other drug use: denied. Tobacco Use: Low  Risk  (12/09/2022)   Patient History    Smoking Tobacco Use: Never    Smokeless Tobacco Use: Never    Passive Exposure: Not on file      If >40:  Last mammogram:  2023, BIRADS 2 Age at menopause: n/a, Mirena IUD in place Last colonoscopy: 2022    _________________________________________________________  Current Outpatient Medications  Medication Sig Dispense Refill   buPROPion (WELLBUTRIN XL) 300 MG 24 hr tablet TAKE 1 TABLET BY MOUTH EVERY DAY     estradiol (CLIMARA - DOSED IN MG/24 HR) 0.05 mg/24hr patch Place 1 patch (0.05 mg total) onto the skin once a week. 12 patch 3   levonorgestrel (MIRENA) 20 MCG/24HR IUD by Intrauterine route.     tretinoin (RETIN-A) 0.05 % cream SMARTSIG:1 Sparingly Topical Every Night     No current facility-administered medications for this visit.   No Known Allergies  Past Medical History:  Diagnosis Date   Abnormal Pap smear of cervix    Anxiety    Depression    Past Surgical History:  Procedure Laterality Date   BREAST BIOPSY Right 2019   benign bx/clip   BREAST SURGERY  03/2018   Right breast biopsy/ Malignant   COLONOSCOPY WITH PROPOFOL N/A 10/30/2020   Procedure: COLONOSCOPY WITH PROPOFOL;  Surgeon: [name], MD;  Location: [facility] ENDOSCOPY;  Service: Gastroenterology;  Laterality: N/A;   COLPOSCOPY  2004   WNL @ [facility]   Mirena  2008   MOHS SURGERY  2018   precancerous   WISDOM TOOTH EXTRACTION     OB History     Gravida  3   Para  2   Term  2   Preterm      AB  1   Living  2      SAB  1   IAB      Ectopic      Multiple      Live Births  2          Social History   Tobacco Use   Smoking status: Never   Smokeless tobacco: Never  Substance Use Topics   Alcohol use: Yes    Comment: social   Social History   Substance and Sexual Activity  Sexual Activity Yes   Partners: Male   Birth control/protection: I.U.D.    Immunization History  Administered Date(s) Administered    Moderna Sars-Covid-2 Vaccination 05/23/2019, 06/22/2019, 02/04/2020   Tdap 02/23/2013, 12/09/2022     Review Of Systems  Constitutional: Denied constitutional symptoms, night sweats, recent illness, fatigue, fever, insomnia and weight loss.  Eyes: Denied eye symptoms, eye pain, photophobia, vision change and visual disturbance.  Ears/Nose/Throat/Neck: Denied ear, nose, throat  or neck symptoms, hearing loss, nasal discharge, sinus congestion and sore throat.  Cardiovascular: Denied cardiovascular symptoms, arrhythmia, chest pain/pressure, edema, exercise intolerance, orthopnea and palpitations.  Respiratory: Denied pulmonary symptoms, asthma, pleuritic pain, productive sputum, cough, dyspnea and wheezing.  Gastrointestinal: Denied, gastro-esophageal reflux, melena, nausea and vomiting.  Genitourinary: Denied genitourinary symptoms including symptomatic vaginal discharge, pelvic relaxation issues, and urinary complaints.  Musculoskeletal: Denied musculoskeletal symptoms, stiffness, swelling, muscle weakness and myalgia.  Dermatologic: Denied dermatology symptoms, rash and scar.  Neurologic: Denied neurology symptoms, dizziness, headache, neck pain and syncope.  Psychiatric: Denied psychiatric symptoms, anxiety and depression.  Endocrine: Positive for low libido, fatigue, brain fog, sleep disturbance, weight gain.      Objective:    BP 125/77   Pulse 62   Ht 5\' 8"  (1.727 m)   Wt 172 lb 4.8 oz (78.2 kg)   BMI 26.20 kg/m   Constitutional: Well-developed, well-nourished female in no acute distress Neurological: Alert and oriented to person, place, and time Psychiatric: Mood and affect appropriate Skin: No rashes or lesions Neck: Supple without masses. Trachea is midline.Thyroid is normal size without masses Lymphatics: No cervical, axillary, supraclavicular, or inguinal adenopathy noted Respiratory: Clear to auscultation bilaterally. Good air movement with normal work of  breathing. Cardiovascular: Regular rate and rhythm. Extremities grossly normal, nontender with no edema; pulses regular Gastrointestinal: Soft, nontender, nondistended. No masses or hernias appreciated. No hepatosplenomegaly. No fluid wave. No rebound or guarding. Breast Exam:  deferred Genitourinary:  deferred   Adnexae: Non-palpable and non-tender Perineum/Anus: No lesions Rectal: deferred    Autumn Messing, CNM  12/09/22 3:20 PM

## 2022-12-10 LAB — TSH+FREE T4
Free T4: 1.21 ng/dL (ref 0.82–1.77)
TSH: 1.19 u[IU]/mL (ref 0.450–4.500)

## 2022-12-17 ENCOUNTER — Ambulatory Visit
Admission: RE | Admit: 2022-12-17 | Discharge: 2022-12-17 | Disposition: A | Discharge status: Home / Self Care | Payer: 59 | Source: Ambulatory Visit

## 2022-12-17 DIAGNOSIS — E079 Disorder of thyroid, unspecified: Secondary | ICD-10-CM | POA: Diagnosis present

## 2023-03-17 ENCOUNTER — Ambulatory Visit
Admission: RE | Admit: 2023-03-17 | Discharge: 2023-03-17 | Disposition: A | Discharge status: Home / Self Care | Payer: BC Managed Care – PPO | Source: Ambulatory Visit

## 2023-03-17 DIAGNOSIS — Z1231 Encounter for screening mammogram for malignant neoplasm of breast: Secondary | ICD-10-CM | POA: Diagnosis not present

## 2023-06-16 DIAGNOSIS — J019 Acute sinusitis, unspecified: Secondary | ICD-10-CM | POA: Diagnosis not present

## 2023-06-16 DIAGNOSIS — H9203 Otalgia, bilateral: Secondary | ICD-10-CM | POA: Diagnosis not present

## 2023-08-10 DIAGNOSIS — L578 Other skin changes due to chronic exposure to nonionizing radiation: Secondary | ICD-10-CM | POA: Diagnosis not present

## 2023-08-10 DIAGNOSIS — L700 Acne vulgaris: Secondary | ICD-10-CM | POA: Diagnosis not present

## 2023-08-10 DIAGNOSIS — Z86018 Personal history of other benign neoplasm: Secondary | ICD-10-CM | POA: Diagnosis not present

## 2023-11-15 ENCOUNTER — Telehealth: Payer: Self-pay

## 2023-11-15 DIAGNOSIS — Z79890 Hormone replacement therapy (postmenopausal): Secondary | ICD-10-CM

## 2023-11-16 MED ORDER — ESTRADIOL 0.05 MG/24HR TD PTWK
0.0500 mg | MEDICATED_PATCH | TRANSDERMAL | 0 refills | Status: DC
Start: 2023-11-16 — End: 2023-12-27

## 2023-11-16 NOTE — Telephone Encounter (Signed)
 Patient called to schedule appointment for annual exam. Needs estradiol  patches sent to pharmacy until she can get in for appointment. I sent 4 patches to pharmacy on file.

## 2023-12-15 ENCOUNTER — Ambulatory Visit: Admitting: Obstetrics and Gynecology

## 2023-12-26 NOTE — Progress Notes (Unsigned)
 PCP: Zina Selma ORN, MD   No chief complaint on file.   HPI:      [name] is a 48 y.o. [id number] whose LMP was No LMP recorded. (Menstrual status: IUD)., presents today for her annual examination.  Her menses are {norm/abn:715}, lasting {number: 22536} days.  Dysmenorrhea {dysmen:716}. She {does:18564} have intermenstrual bleeding. She {does:18564} have vasomotor sx. On ERT  Sex activity: {sex active: 315163}. Mirena  Replaced 08/22/20. She {does:18564} have vaginal dryness/pain/bleeding.  Last Pap: 07/24/20  Results were: no abnormalities /neg HPV DNA.  Hx of STDs: {STD hx:14358}  Last mammogram: 03/17/23 Results were: normal--routine follow-up in 12 months There is no FH of breast cancer. There is no FH of ovarian cancer. The patient {does:18564} do self-breast exams.  Colonoscopy: 10/30/20 Repeat due after 10*** years.   Tobacco use: {tob:20664} Alcohol use: {Alcohol:11675} No drug use Exercise: {exercise:31265}  She {does:18564} get adequate calcium and Vitamin D in her diet.  Labs with PCP.   Patient Active Problem List   Diagnosis Date Noted   Well woman exam 12/09/2022   Thyroid  mass of unclear etiology 12/09/2022    Past Surgical History:  Procedure Laterality Date   BREAST BIOPSY Right 2019   benign bx/clip   BREAST SURGERY  03/2018   Right breast biopsy/ Malignant   COLONOSCOPY WITH PROPOFOL  N/A 10/30/2020   Procedure: COLONOSCOPY WITH PROPOFOL ;  Surgeon: Therisa Bi, MD;  Location: [facility] ENDOSCOPY;  Service: Gastroenterology;  Laterality: N/A;   COLPOSCOPY  2004   WNL @ [facility]   Mirena   2008   MOHS SURGERY  2018   precancerous   WISDOM TOOTH EXTRACTION      Family History  Problem Relation Age of Onset   Kidney disease Mother    Kidney disease Maternal Grandmother    Heart disease Maternal Grandfather    Healthy Father    Breast cancer Neg Hx     Social History   Socioeconomic History   Marital status: Married    Spouse  name: Not on file   Number of children: Not on file   Years of education: Not on file   Highest education level: Not on file  Occupational History   Not on file  Tobacco Use   Smoking status: Never   Smokeless tobacco: Never  Vaping Use   Vaping status: Never Used  Substance and Sexual Activity   Alcohol use: Yes    Comment: social   Drug use: Never   Sexual activity: Yes    Partners: Male    Birth control/protection: I.U.D.  Other Topics Concern   Not on file  Social History Narrative   Not on file   Social Drivers of Health   Financial Resource Strain: Not on file  Food Insecurity: Not on file  Transportation Needs: Not on file  Physical Activity: Not on file  Stress: Not on file  Social Connections: Not on file  Intimate Partner Violence: Not on file     Current Outpatient Medications:    buPROPion (WELLBUTRIN XL) 300 MG 24 hr tablet, TAKE 1 TABLET BY MOUTH EVERY DAY, Disp: , Rfl:    estradiol  (CLIMARA  - DOSED IN MG/24 HR) 0.05 mg/24hr patch, Place 1 patch (0.05 mg total) onto the skin once a week., Disp: 4 patch, Rfl: 0   levonorgestrel  (MIRENA ) 20 MCG/24HR IUD, by Intrauterine route., Disp: , Rfl:    tretinoin (RETIN-A) 0.05 % cream, SMARTSIG:1 Sparingly Topical Every Night, Disp: , Rfl:  ROS:  Review of Systems BREAST: No symptoms    Objective: There were no vitals taken for this visit.   OBGyn Exam  Results: No results found for this or any previous visit (from the past 24 hours).  Assessment/Plan:  No diagnosis found.   No orders of the defined types were placed in this encounter.           GYN counsel {counseling: 16159}    F/U  No follow-ups on file.  Adeja Sarratt B. Albion Weatherholtz, PA-C 12/26/2023 4:29 PM

## 2023-12-27 ENCOUNTER — Other Ambulatory Visit (HOSPITAL_COMMUNITY)
Admission: RE | Admit: 2023-12-27 | Discharge: 2023-12-27 | Disposition: A | Discharge status: Home / Self Care | Source: Ambulatory Visit | Attending: Obstetrics and Gynecology | Admitting: Obstetrics and Gynecology

## 2023-12-27 ENCOUNTER — Encounter: Payer: Self-pay | Admitting: Obstetrics and Gynecology

## 2023-12-27 ENCOUNTER — Ambulatory Visit (INDEPENDENT_AMBULATORY_CARE_PROVIDER_SITE_OTHER): Admitting: Obstetrics and Gynecology

## 2023-12-27 VITALS — BP 106/73 | HR 73 | Ht 67.0 in | Wt 189.0 lb

## 2023-12-27 DIAGNOSIS — N951 Menopausal and female climacteric states: Secondary | ICD-10-CM | POA: Diagnosis not present

## 2023-12-27 DIAGNOSIS — R6882 Decreased libido: Secondary | ICD-10-CM

## 2023-12-27 DIAGNOSIS — Z124 Encounter for screening for malignant neoplasm of cervix: Secondary | ICD-10-CM

## 2023-12-27 DIAGNOSIS — Z1151 Encounter for screening for human papillomavirus (HPV): Secondary | ICD-10-CM | POA: Insufficient documentation

## 2023-12-27 DIAGNOSIS — Z01419 Encounter for gynecological examination (general) (routine) without abnormal findings: Secondary | ICD-10-CM | POA: Diagnosis not present

## 2023-12-27 DIAGNOSIS — Z30431 Encounter for routine checking of intrauterine contraceptive device: Secondary | ICD-10-CM

## 2023-12-27 DIAGNOSIS — Z79890 Hormone replacement therapy (postmenopausal): Secondary | ICD-10-CM

## 2023-12-27 DIAGNOSIS — Z975 Presence of (intrauterine) contraceptive device: Secondary | ICD-10-CM

## 2023-12-27 DIAGNOSIS — Z1231 Encounter for screening mammogram for malignant neoplasm of breast: Secondary | ICD-10-CM

## 2023-12-27 MED ORDER — PROGESTERONE MICRONIZED 100 MG PO CAPS
100.0000 mg | ORAL_CAPSULE | Freq: Every evening | ORAL | 3 refills | Status: AC
Start: 2023-12-27 — End: ?

## 2023-12-27 MED ORDER — ESTRADIOL 0.05 MG/24HR TD PTWK: 0.0500 mg | MEDICATED_PATCH | TRANSDERMAL | 3 refills | Status: AC

## 2023-12-27 NOTE — Patient Instructions (Signed)
 I value your feedback and you entrusting [place] with your care. If you get a [place] patient survey, I would appreciate you taking the time to let us know about your experience today. Thank you! ? ? ?

## 2023-12-30 LAB — CYTOLOGY - PAP
Comment: NEGATIVE
Diagnosis: NEGATIVE
High risk HPV: NEGATIVE
